# Patient Record
Sex: Male | Born: 1970 | Race: Black or African American | Hispanic: No | Marital: Married | Smoking: Former smoker
Health system: Southern US, Community
[De-identification: ages and names within clinical notes are randomized; demographics above are authoritative.]

## PROBLEM LIST (undated history)

## (undated) DIAGNOSIS — F17200 Nicotine dependence, unspecified, uncomplicated: Secondary | ICD-10-CM

## (undated) DIAGNOSIS — R740 Nonspecific elevation of levels of transaminase and lactic acid dehydrogenase [LDH]: Secondary | ICD-10-CM

## (undated) DIAGNOSIS — I10 Essential (primary) hypertension: Secondary | ICD-10-CM

## (undated) DIAGNOSIS — D693 Immune thrombocytopenic purpura: Secondary | ICD-10-CM

## (undated) HISTORY — DX: Immune thrombocytopenic purpura: D69.3

## (undated) HISTORY — DX: Nicotine dependence, unspecified, uncomplicated: F17.200

## (undated) HISTORY — DX: Nonspecific elevation of levels of transaminase and lactic acid dehydrogenase (ldh): R74.0

## (undated) HISTORY — DX: Essential (primary) hypertension: I10

---

## 2004-10-12 ENCOUNTER — Ambulatory Visit: Payer: Self-pay | Admitting: Family Medicine

## 2006-02-15 DIAGNOSIS — R7401 Elevation of levels of liver transaminase levels: Secondary | ICD-10-CM

## 2006-02-15 HISTORY — DX: Elevation of levels of liver transaminase levels: R74.01

## 2006-12-12 DIAGNOSIS — Z87891 Personal history of nicotine dependence: Secondary | ICD-10-CM | POA: Insufficient documentation

## 2006-12-14 ENCOUNTER — Ambulatory Visit: Payer: Self-pay | Admitting: Family Medicine

## 2006-12-15 LAB — CONVERTED CEMR LAB
ALT: 146 units/L — ABNORMAL HIGH (ref 0–53)
AST: 63 units/L — ABNORMAL HIGH (ref 0–37)
Albumin: 4.2 g/dL (ref 3.5–5.2)
Alkaline Phosphatase: 54 units/L (ref 39–117)
BUN: 12 mg/dL (ref 6–23)
Basophils Absolute: 0 10*3/uL (ref 0.0–0.1)
Basophils Relative: 0.3 % (ref 0.0–1.0)
Bilirubin, Direct: 0.1 mg/dL (ref 0.0–0.3)
CO2: 31 meq/L (ref 19–32)
Calcium: 9.5 mg/dL (ref 8.4–10.5)
Chloride: 100 meq/L (ref 96–112)
Cholesterol: 191 mg/dL (ref 0–200)
Creatinine, Ser: 1 mg/dL (ref 0.4–1.5)
Eosinophils Absolute: 0.2 10*3/uL (ref 0.0–0.6)
Eosinophils Relative: 3.3 % (ref 0.0–5.0)
GFR calc Af Amer: 109 mL/min
GFR calc non Af Amer: 90 mL/min
Glucose, Bld: 90 mg/dL (ref 70–99)
HCT: 46.7 % (ref 39.0–52.0)
HDL: 50.9 mg/dL (ref >39.0)
Hemoglobin: 16 g/dL (ref 13.0–17.0)
LDL Cholesterol: 112 mg/dL — ABNORMAL HIGH (ref 0–99)
Lymphocytes Relative: 29.5 % (ref 12.0–46.0)
MCHC: 34.2 g/dL (ref 30.0–36.0)
MCV: 83.2 fL (ref 78.0–100.0)
Monocytes Absolute: 0.7 10*3/uL (ref 0.2–0.7)
Monocytes Relative: 11.4 % — ABNORMAL HIGH (ref 3.0–11.0)
Neutro Abs: 3.5 10*3/uL (ref 1.4–7.7)
Neutrophils Relative %: 55.5 % (ref 43.0–77.0)
PSA: 0.64 ng/mL (ref 0.10–4.00)
Platelets: 122 10*3/uL — ABNORMAL LOW (ref 150–400)
Potassium: 4.7 meq/L (ref 3.5–5.1)
RBC: 5.61 M/uL (ref 4.22–5.81)
RDW: 13.5 % (ref 11.5–14.6)
Sodium: 138 meq/L (ref 135–145)
TSH: 1.3 microintl units/mL (ref 0.35–5.50)
Total Bilirubin: 0.7 mg/dL (ref 0.3–1.2)
Total CHOL/HDL Ratio: 3.8
Total Protein: 7.4 g/dL (ref 6.0–8.3)
Triglycerides: 140 mg/dL (ref 0–149)
VLDL: 28 mg/dL (ref 0–40)
WBC: 6.3 10*3/uL (ref 4.5–10.5)

## 2006-12-21 ENCOUNTER — Ambulatory Visit: Payer: Self-pay | Admitting: Family Medicine

## 2006-12-21 DIAGNOSIS — R740 Nonspecific elevation of levels of transaminase and lactic acid dehydrogenase [LDH]: Secondary | ICD-10-CM

## 2006-12-21 DIAGNOSIS — D693 Immune thrombocytopenic purpura: Secondary | ICD-10-CM

## 2006-12-21 DIAGNOSIS — R7401 Elevation of levels of liver transaminase levels: Secondary | ICD-10-CM | POA: Insufficient documentation

## 2006-12-21 HISTORY — DX: Immune thrombocytopenic purpura: D69.3

## 2006-12-25 LAB — CONVERTED CEMR LAB
Ceruloplasmin: 29 mg/dL (ref 21–63)
HCV Ab: NEGATIVE
Hep A IgM: NEGATIVE
Hep B C IgM: NEGATIVE
Hepatitis B Surface Ag: NEGATIVE

## 2007-01-13 ENCOUNTER — Ambulatory Visit: Payer: Self-pay | Admitting: Oncology

## 2007-01-13 LAB — CONVERTED CEMR LAB
ALT: 172 units/L — ABNORMAL HIGH (ref 0–53)
AST: 69 units/L — ABNORMAL HIGH (ref 0–37)
Albumin: 4.1 g/dL (ref 3.5–5.2)
Alkaline Phosphatase: 51 units/L (ref 39–117)
Basophils Absolute: 0 10*3/uL (ref 0.0–0.1)
Basophils Relative: 0.5 % (ref 0.0–1.0)
Bilirubin, Direct: 0.1 mg/dL (ref 0.0–0.3)
Eosinophils Absolute: 0.2 10*3/uL (ref 0.0–0.6)
Eosinophils Relative: 3.1 % (ref 0.0–5.0)
Ferritin: 350.5 ng/mL — ABNORMAL HIGH (ref 22.0–322.0)
HCT: 47.3 % (ref 39.0–52.0)
Hemoglobin: 16.1 g/dL (ref 13.0–17.0)
Lymphocytes Relative: 31 % (ref 12.0–46.0)
MCHC: 33.9 g/dL (ref 30.0–36.0)
MCV: 83.5 fL (ref 78.0–100.0)
Monocytes Absolute: 0.5 10*3/uL (ref 0.2–0.7)
Monocytes Relative: 9.8 % (ref 3.0–11.0)
Neutro Abs: 2.9 10*3/uL (ref 1.4–7.7)
Neutrophils Relative %: 55.6 % (ref 43.0–77.0)
Platelets: 118 10*3/uL — ABNORMAL LOW (ref 150–400)
RBC: 5.67 M/uL (ref 4.22–5.81)
RDW: 13.6 % (ref 11.5–14.6)
Sed Rate: 3 mm/hr (ref 0–20)
Total Bilirubin: 0.9 mg/dL (ref 0.3–1.2)
Total Protein: 7.3 g/dL (ref 6.0–8.3)
WBC: 5.2 10*3/uL (ref 4.5–10.5)

## 2007-03-01 ENCOUNTER — Ambulatory Visit: Payer: Self-pay | Admitting: Oncology

## 2007-04-14 LAB — CBC WITH DIFFERENTIAL (CANCER CENTER ONLY)
BASO#: 0.1 10*3/uL (ref 0.0–0.2)
BASO%: 1.2 % (ref 0.0–2.0)
EOS%: 3.5 % (ref 0.0–7.0)
Eosinophils Absolute: 0.2 10*3/uL (ref 0.0–0.5)
HCT: 46.5 % (ref 38.7–49.9)
HGB: 15.9 g/dL (ref 13.0–17.1)
LYMPH#: 2.2 10*3/uL (ref 0.9–3.3)
LYMPH%: 35.3 % (ref 14.0–48.0)
MCH: 27.8 pg — ABNORMAL LOW (ref 28.0–33.4)
MCHC: 34.2 g/dL (ref 32.0–35.9)
MCV: 81 fL — ABNORMAL LOW (ref 82–98)
MONO#: 0.5 10*3/uL (ref 0.1–0.9)
MONO%: 7.5 % (ref 0.0–13.0)
NEUT#: 3.3 10*3/uL (ref 1.5–6.5)
NEUT%: 52.5 % (ref 40.0–80.0)
Platelets: 127 10*3/uL — ABNORMAL LOW (ref 145–400)
RBC: 5.72 10*6/uL — ABNORMAL HIGH (ref 4.20–5.70)
RDW: 13.1 % (ref 10.5–14.6)
WBC: 6.3 10*3/uL (ref 4.0–10.0)

## 2007-10-10 ENCOUNTER — Ambulatory Visit: Payer: Self-pay | Admitting: Oncology

## 2007-10-11 ENCOUNTER — Encounter: Payer: Self-pay | Admitting: Family Medicine

## 2007-10-11 LAB — CBC WITH DIFFERENTIAL (CANCER CENTER ONLY)
BASO#: 0 10*3/uL (ref 0.0–0.2)
BASO%: 0.6 % (ref 0.0–2.0)
EOS%: 2.9 % (ref 0.0–7.0)
Eosinophils Absolute: 0.2 10*3/uL (ref 0.0–0.5)
HCT: 44.9 % (ref 38.7–49.9)
HGB: 15.6 g/dL (ref 13.0–17.1)
LYMPH#: 1.8 10*3/uL (ref 0.9–3.3)
LYMPH%: 29.8 % (ref 14.0–48.0)
MCH: 28.1 pg (ref 28.0–33.4)
MCHC: 34.8 g/dL (ref 32.0–35.9)
MCV: 81 fL — ABNORMAL LOW (ref 82–98)
MONO#: 0.5 10*3/uL (ref 0.1–0.9)
MONO%: 8 % (ref 0.0–13.0)
NEUT#: 3.5 10*3/uL (ref 1.5–6.5)
NEUT%: 58.7 % (ref 40.0–80.0)
Platelets: 188 10*3/uL (ref 145–400)
RBC: 5.56 10*6/uL (ref 4.20–5.70)
RDW: 13 % (ref 10.5–14.6)
WBC: 5.9 10*3/uL (ref 4.0–10.0)

## 2007-10-11 LAB — CHCC SATELLITE - SMEAR

## 2010-02-20 ENCOUNTER — Ambulatory Visit
Admission: RE | Admit: 2010-02-20 | Discharge: 2010-02-20 | Payer: Self-pay | Source: Home / Self Care | Attending: Family Medicine | Admitting: Family Medicine

## 2010-02-20 ENCOUNTER — Other Ambulatory Visit: Payer: Self-pay | Admitting: Family Medicine

## 2010-02-20 DIAGNOSIS — M79609 Pain in unspecified limb: Secondary | ICD-10-CM | POA: Insufficient documentation

## 2010-02-20 LAB — CBC WITH DIFFERENTIAL/PLATELET
Basophils Absolute: 0 10*3/uL (ref 0.0–0.1)
Basophils Relative: 0.4 % (ref 0.0–3.0)
Eosinophils Absolute: 0.1 10*3/uL (ref 0.0–0.7)
Eosinophils Relative: 1.5 % (ref 0.0–5.0)
HCT: 46.6 % (ref 39.0–52.0)
Hemoglobin: 16 g/dL (ref 13.0–17.0)
Lymphocytes Relative: 27.4 % (ref 12.0–46.0)
Lymphs Abs: 1.2 10*3/uL (ref 0.7–4.0)
MCHC: 34.3 g/dL (ref 30.0–36.0)
MCV: 83.9 fl (ref 78.0–100.0)
Monocytes Absolute: 0.5 10*3/uL (ref 0.1–1.0)
Monocytes Relative: 11.8 % (ref 3.0–12.0)
Neutro Abs: 2.6 10*3/uL (ref 1.4–7.7)
Neutrophils Relative %: 58.9 % (ref 43.0–77.0)
Platelets: 131 10*3/uL — ABNORMAL LOW (ref 150.0–400.0)
RBC: 5.56 Mil/uL (ref 4.22–5.81)
RDW: 14.7 % — ABNORMAL HIGH (ref 11.5–14.6)
WBC: 4.4 10*3/uL — ABNORMAL LOW (ref 4.5–10.5)

## 2010-02-20 LAB — URIC ACID: Uric Acid, Serum: 3.8 mg/dL — ABNORMAL LOW (ref 4.0–7.8)

## 2010-03-19 NOTE — Assessment & Plan Note (Signed)
Summary: reestablish/ hand is swollen/alc   Vital Signs:  Patient profile:   40 year old male Height:      73.5 inches Weight:      216.75 pounds BMI:     28.31 Temp:     98.3 degrees F oral Pulse rate:   78 / minute Pulse rhythm:   regular BP sitting:   130 / 84  (left arm) Cuff size:   large  Vitals Entered By: Selena Batten Dance CMA (AAMA) (February 20, 2010 8:42 AM) CC: Re-establish care and left hand is swollen   History of Present Illness: CC: L hand swelling, re establish  4d h/o L hand swelling, painful to grip.  on back of hand.  Has tried tylenol, ibuprofen.  Has tried ice.  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  denies injury or scratches.  denies erythema or streaking or warmth.  + dog at home, no cats.  No h/o gout, no f/c, abd pain, v/n, no pain anywhere else.  Never had anything like this before.  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  + minimal erythema of dorsum of hand.  no significant warmth.  weak grip no scaphoid tenderness.  no wrist tenderness with palpation.  no MCP joint or phalageal joint tenderness. Pulses:  2+ rad pulses Neurologic:  sensation intact. strength intact bilaterally   Impression & Recommendations:  Problem # 1:  HAND PAIN, LEFT (ICD-729.5) denies trauma.  xray bones look intact.  infectious vs  inflammatory condition (gout, synovitis).  treat for both for now with abx and nsaid rest and elevation and ice.  cbc and uric acid today.  Orders: Venipuncture (91478) TLB-CBC Platelet - w/Differential (85025-CBCD) TLB-Uric Acid, Blood (84550-URIC) T-Hand Left 3 Views (73130TC)  Complete Medication List: 1)  Naprosyn 500 Mg Tabs (Naproxen) .... Take one by mouth two times a day x 10 days then as needed with food, don't take with ibuprofen 2)  Bactrim Ds 800-160 Mg Tabs (Sulfamethoxazole-trimethoprim) .... Take two  by mouth two times a day x 10 days  Patient Instructions: 1)  Return next week for follow up. 2)  Xray of hand today. 3)  Treat as inflammation and infection with naprosyn twice daily for 10 days and bactrim 2 pills twice daily for 10 days. 4)  don't take ibuprofen while on naprosyn. 5)  lab work today to check gout, infection. Prescriptions: BACTRIM DS 800-160 MG TABS (SULFAMETHOXAZOLE-TRIMETHOPRIM) take two  by mouth two times a day x 10 days  #40 x 0   Entered and Authorized by:   Eustaquio Boyden  MD   Signed by:   Eustaquio Boyden  MD on 02/20/2010   Method used:   Print then Give to Patient   RxID:   [id number] NAPROSYN 500 MG TABS (NAPROXEN) take one by mouth two times a  day x 10 days then as needed with food, don't take with ibuprofen  #30 x 0   Entered and Authorized by:   Eustaquio Boyden  MD   Signed by:   Eustaquio Boyden  MD on 02/20/2010   Method used:   Print then Give to Patient   RxID:   [id number]    Orders Added: 1)  Venipuncture [56213] 2)  TLB-CBC Platelet - w/Differential [85025-CBCD] 3)  TLB-Uric Acid, Blood [84550-URIC] 4)  T-Hand Left 3 Views [73130TC] 5)  New Patient Level II [99202]    Prior Medications: Current Allergies (reviewed today): No known allergies

## 2012-10-04 ENCOUNTER — Other Ambulatory Visit: Payer: Self-pay | Admitting: Family Medicine

## 2012-10-04 ENCOUNTER — Encounter: Payer: Self-pay | Admitting: Family Medicine

## 2012-10-04 DIAGNOSIS — Z8042 Family history of malignant neoplasm of prostate: Secondary | ICD-10-CM

## 2012-10-04 DIAGNOSIS — Z125 Encounter for screening for malignant neoplasm of prostate: Secondary | ICD-10-CM

## 2012-10-04 DIAGNOSIS — R7401 Elevation of levels of liver transaminase levels: Secondary | ICD-10-CM

## 2012-10-04 DIAGNOSIS — D696 Thrombocytopenia, unspecified: Secondary | ICD-10-CM

## 2012-10-05 ENCOUNTER — Other Ambulatory Visit (INDEPENDENT_AMBULATORY_CARE_PROVIDER_SITE_OTHER): Payer: Federal, State, Local not specified - PPO

## 2012-10-05 DIAGNOSIS — Z125 Encounter for screening for malignant neoplasm of prostate: Secondary | ICD-10-CM

## 2012-10-05 DIAGNOSIS — Z8042 Family history of malignant neoplasm of prostate: Secondary | ICD-10-CM

## 2012-10-05 DIAGNOSIS — R7401 Elevation of levels of liver transaminase levels: Secondary | ICD-10-CM

## 2012-10-05 DIAGNOSIS — D696 Thrombocytopenia, unspecified: Secondary | ICD-10-CM

## 2012-10-05 LAB — CBC WITH DIFFERENTIAL/PLATELET
Basophils Absolute: 0 10*3/uL (ref 0.0–0.1)
Basophils Relative: 0.5 % (ref 0.0–3.0)
Eosinophils Absolute: 0.2 10*3/uL (ref 0.0–0.7)
Eosinophils Relative: 3 % (ref 0.0–5.0)
HCT: 47.2 % (ref 39.0–52.0)
Hemoglobin: 16 g/dL (ref 13.0–17.0)
Lymphocytes Relative: 29.8 % (ref 12.0–46.0)
Lymphs Abs: 1.6 10*3/uL (ref 0.7–4.0)
MCHC: 33.9 g/dL (ref 30.0–36.0)
MCV: 83.2 fl (ref 78.0–100.0)
Monocytes Absolute: 0.6 10*3/uL (ref 0.1–1.0)
Monocytes Relative: 11.3 % (ref 3.0–12.0)
Neutro Abs: 3 10*3/uL (ref 1.4–7.7)
Neutrophils Relative %: 55.4 % (ref 43.0–77.0)
Platelets: 122 10*3/uL — ABNORMAL LOW (ref 150.0–400.0)
RBC: 5.68 Mil/uL (ref 4.22–5.81)
RDW: 14.1 % (ref 11.5–14.6)
WBC: 5.4 10*3/uL (ref 4.5–10.5)

## 2012-10-05 LAB — LIPID PANEL
Cholesterol: 170 mg/dL (ref 0–200)
HDL: 50.6 mg/dL (ref >39.00)
LDL Cholesterol: 108 mg/dL — ABNORMAL HIGH (ref 0–99)
Total CHOL/HDL Ratio: 3
Triglycerides: 56 mg/dL (ref 0.0–149.0)
VLDL: 11.2 mg/dL (ref 0.0–40.0)

## 2012-10-05 LAB — COMPREHENSIVE METABOLIC PANEL
ALT: 28 U/L (ref 0–53)
AST: 29 U/L (ref 0–37)
Albumin: 4.3 g/dL (ref 3.5–5.2)
Alkaline Phosphatase: 47 U/L (ref 39–117)
BUN: 19 mg/dL (ref 6–23)
CO2: 28 mEq/L (ref 19–32)
Calcium: 9.2 mg/dL (ref 8.4–10.5)
Chloride: 104 mEq/L (ref 96–112)
Creatinine, Ser: 1 mg/dL (ref 0.4–1.5)
GFR: 109.13 mL/min (ref >60.00)
Glucose, Bld: 95 mg/dL (ref 70–99)
Potassium: 4.4 mEq/L (ref 3.5–5.1)
Sodium: 137 mEq/L (ref 135–145)
Total Bilirubin: 0.8 mg/dL (ref 0.3–1.2)
Total Protein: 7.3 g/dL (ref 6.0–8.3)

## 2012-10-05 LAB — PSA: PSA: 0.92 ng/mL (ref 0.10–4.00)

## 2012-10-13 ENCOUNTER — Encounter: Payer: Self-pay | Admitting: Family Medicine

## 2012-10-13 ENCOUNTER — Ambulatory Visit (INDEPENDENT_AMBULATORY_CARE_PROVIDER_SITE_OTHER): Payer: Federal, State, Local not specified - PPO | Admitting: Family Medicine

## 2012-10-13 VITALS — BP 120/84 | HR 68 | Temp 98.2°F | Ht 73.5 in | Wt 225.0 lb

## 2012-10-13 DIAGNOSIS — D696 Thrombocytopenia, unspecified: Secondary | ICD-10-CM

## 2012-10-13 DIAGNOSIS — Z0000 Encounter for general adult medical examination without abnormal findings: Secondary | ICD-10-CM | POA: Insufficient documentation

## 2012-10-13 DIAGNOSIS — F17200 Nicotine dependence, unspecified, uncomplicated: Secondary | ICD-10-CM

## 2012-10-13 DIAGNOSIS — Z23 Encounter for immunization: Secondary | ICD-10-CM

## 2012-10-13 NOTE — Addendum Note (Signed)
Addended by: Eliezer Bottom on: 10/13/2012 02:48 PM   Modules accepted: Orders

## 2012-10-13 NOTE — Progress Notes (Signed)
Subjective:    Patient ID: Mike Knight, male    DOB: 1970/05/29, 42 y.o.   MRN: [id number]  HPI CC: CPE  Physical for foster care today.  Wt Readings from Last 3 Encounters:  10/13/12 225 lb (102.059 kg)  02/20/10 216 lb 12 oz (98.317 kg)  12/21/06 223 lb (101.152 kg)   Preventative: Prostate cancer screening - would like to defer for now.  Father dx at age 22 yo of prostate cancer.  No nocturia, strong stream.  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  Past histories reviewed and updated if relevant as below. Patient Active Problem List   Diagnosis Date Noted  . HAND PAIN, LEFT 02/20/2010  . THROMBOCYTOPENIA 12/21/2006  . TRANSAMINASES, SERUM, ELEVATED 12/21/2006  . TOBACCO ABUSE 12/12/2006   Past Medical History  Diagnosis Date  . Transaminitis 2008    normal hep panel, ceruloplasmin, elevated ferritin  . Smoker    History reviewed. No pertinent past surgical history. History  Substance Use Topics  . Smoking status: Former Smoker    Quit date: 09/15/2012  . Smokeless tobacco: Never Used  . Alcohol Use: Yes     Comment: occasional   Family History  Problem Relation Age of Onset  . Cancer Father 65    prostate s/p removal  . Diabetes Paternal Grandfather   . Hypertension Paternal Grandfather   . Cancer Maternal Grandfather     lung, smoker   No Known Allergies No current outpatient prescriptions on file prior to visit.   No current facility-administered medications on file prior to visit.     Review of Systems  Constitutional: Negative for fever, chills, activity change, appetite change, fatigue and unexpected weight change.  HENT: Negative for hearing loss and neck pain.   Eyes: Negative for visual  disturbance.  Respiratory: Negative for cough, chest tightness, shortness of breath and wheezing.   Cardiovascular: Negative for chest pain, palpitations and leg swelling.  Gastrointestinal: Negative for nausea, vomiting, abdominal pain, diarrhea, constipation, blood in stool and abdominal distention.  Genitourinary: Negative for hematuria and difficulty urinating.  Musculoskeletal: Negative for myalgias and arthralgias.  Skin: Negative for rash.  Neurological: Negative for dizziness, seizures, syncope and headaches.  Hematological: Negative for adenopathy. Does not bruise/bleed easily.  Psychiatric/Behavioral: Negative for dysphoric mood. The patient is not nervous/anxious.        Objective:   Physical Exam  Nursing note and vitals reviewed. Constitutional: He is oriented to person, place, and time. He appears well-developed and well-nourished. No distress.  HENT:  Head: Normocephalic and atraumatic.  Right Ear: Hearing, tympanic membrane, external ear and ear canal normal.  Left Ear: Hearing, tympanic membrane, external ear and ear canal normal.  Nose: Nose normal.  Mouth/Throat: Uvula is midline and oropharynx is clear and moist. No oropharyngeal exudate, posterior oropharyngeal edema, posterior oropharyngeal erythema or tonsillar abscesses.  Eyes: Conjunctivae and EOM are normal. Pupils are equal, round, and reactive to light. No scleral icterus.  Neck: Normal range of motion. Neck supple. No thyromegaly present.  Cardiovascular: Normal rate, regular rhythm, normal heart sounds and intact distal pulses.   No murmur heard. Pulses:      Radial pulses are 2+ on the right side, and 2+ on the left side.  Pulmonary/Chest: Effort normal and breath  sounds normal. No respiratory distress. He has no wheezes. He has no rales.  Abdominal: Soft. Bowel sounds are normal. He exhibits no distension and no mass. There is no tenderness. There is no rebound and no guarding.  Musculoskeletal: Normal  range of motion. He exhibits no edema.  Lymphadenopathy:    He has no cervical adenopathy.  Neurological: He is alert and oriented to person, place, and time.  CN grossly intact, station and gait intact  Skin: Skin is warm and dry. No rash noted.  Psychiatric: He has a normal mood and affect. His behavior is normal. Judgment and thought content normal.      Assessment & Plan:

## 2012-10-13 NOTE — Assessment & Plan Note (Signed)
Chronic. Per patient, has been told his platelets were larger than normal but functioning properly.

## 2012-10-13 NOTE — Patient Instructions (Signed)
Good to see you today Tdap today. Form filled out Return in 1 year for next physical.

## 2012-10-13 NOTE — Assessment & Plan Note (Signed)
Quit 09/2012 - encouraged continued abstinence.

## 2012-10-13 NOTE — Assessment & Plan Note (Signed)
Preventative protocols reviewed and updated unless pt declined. Discussed healthy diet and lifestyle.  Congratulated on recent weight loss - 8 lbs in last 2-3 weeks, healthier diet and activity.

## 2013-01-17 ENCOUNTER — Emergency Department: Payer: Self-pay | Admitting: Emergency Medicine

## 2013-01-19 ENCOUNTER — Encounter: Payer: Self-pay | Admitting: Family Medicine

## 2013-01-19 ENCOUNTER — Ambulatory Visit (INDEPENDENT_AMBULATORY_CARE_PROVIDER_SITE_OTHER): Payer: Federal, State, Local not specified - PPO | Admitting: Family Medicine

## 2013-01-19 VITALS — BP 138/84 | HR 69 | Temp 97.4°F | Wt 238.8 lb

## 2013-01-19 DIAGNOSIS — S39012A Strain of muscle, fascia and tendon of lower back, initial encounter: Secondary | ICD-10-CM | POA: Insufficient documentation

## 2013-01-19 DIAGNOSIS — M5432 Sciatica, left side: Secondary | ICD-10-CM

## 2013-01-19 DIAGNOSIS — M5430 Sciatica, unspecified side: Secondary | ICD-10-CM
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
## 2013-01-19 NOTE — Progress Notes (Signed)
Pre-visit discussion using our clinic review tool. No additional management support is needed unless otherwise documented below in the visit note.  
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
## 2013-01-25 ENCOUNTER — Ambulatory Visit (INDEPENDENT_AMBULATORY_CARE_PROVIDER_SITE_OTHER): Payer: Federal, State, Local not specified - PPO | Admitting: Family Medicine

## 2013-01-25 ENCOUNTER — Encounter: Payer: Self-pay | Admitting: Family Medicine

## 2013-01-25 VITALS — BP 118/82 | HR 72 | Temp 97.7°F | Wt 234.5 lb

## 2013-01-25 DIAGNOSIS — Z5189 Encounter for other specified aftercare: Secondary | ICD-10-CM

## 2013-01-25 DIAGNOSIS — S39012D Strain of muscle, fascia and tendon of lower back, subsequent encounter: Secondary | ICD-10-CM

## 2013-01-25 MED ORDER — TRAMADOL HCL 50 MG PO TABS: 50.0000 mg | ORAL_TABLET | Freq: Four times a day (QID) | ORAL | Status: DC | PRN
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
## 2013-01-25 NOTE — Assessment & Plan Note (Signed)
Exam today no longer consistent with left sided sciatica but rather persistent upper lumbar paraspinous muscle strain. Markedly improved exam today, however persistent stiffness and pain with certain movements ie bending forward.   Lumbar muscles remain tight and in spasm. Encouraged continued medication use as well as start ice / heating pad to left lower back I recommend he return to work on Monday 01/29/2013 for light duty for 1 week and regular duty Monday 02/05/2013. Letter provided today. Encouraged continued home stretches provided last visit for sciatica.

## 2013-01-25 NOTE — Progress Notes (Signed)
Pre-visit discussion using our clinic review tool. No additional management support is needed unless otherwise documented below in the visit note.  

## 2013-01-25 NOTE — Patient Instructions (Signed)
There is improvement-  I recommend light duty starting 01/29/2013 for 1 week then return to normal activity level.  Continue staying active - this will speed recovery. Ice/heat to back  Whichever soothes better. May continue medicines as up to now. I've refilled tramadol.

## 2013-03-14 ENCOUNTER — Ambulatory Visit (INDEPENDENT_AMBULATORY_CARE_PROVIDER_SITE_OTHER): Payer: Federal, State, Local not specified - PPO | Admitting: Family Medicine

## 2013-03-14 ENCOUNTER — Encounter: Payer: Self-pay | Admitting: Family Medicine

## 2013-03-14 VITALS — BP 110/78 | HR 88 | Temp 97.4°F | Wt 235.5 lb

## 2013-03-14 DIAGNOSIS — S335XXA Sprain of ligaments of lumbar spine, initial encounter: Secondary | ICD-10-CM

## 2013-03-14 DIAGNOSIS — S39012A Strain of muscle, fascia and tendon of lower back, initial encounter: Secondary | ICD-10-CM

## 2013-03-14 NOTE — Patient Instructions (Signed)
I think you are doing well today. Letter provided. Call us with questions.

## 2013-03-14 NOTE — Assessment & Plan Note (Signed)
Seems to have fully healed from lumbar strain. Back to regular duty at work for the last month. Not needing anti inflammatory or muscle relaxant Back to gym - discussed slow increase in activity especially weights. Letter provided today.
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
## 2013-03-14 NOTE — Progress Notes (Signed)
Pre-visit discussion using our clinic review tool. No additional management support is needed unless otherwise documented below in the visit note.  

## 2013-05-08 ENCOUNTER — Telehealth: Payer: Self-pay
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
## 2014-05-01 ENCOUNTER — Encounter: Payer: Self-pay | Admitting: Family Medicine

## 2014-05-01 ENCOUNTER — Ambulatory Visit (INDEPENDENT_AMBULATORY_CARE_PROVIDER_SITE_OTHER): Payer: Federal, State, Local not specified - PPO | Admitting: Family Medicine

## 2014-05-01 VITALS — BP 150/100 | HR 76 | Temp 98.1°F | Ht 73.5 in | Wt 234.8 lb

## 2014-05-01 DIAGNOSIS — E669 Obesity, unspecified: Secondary | ICD-10-CM | POA: Insufficient documentation

## 2014-05-01 DIAGNOSIS — I10 Essential (primary) hypertension: Secondary | ICD-10-CM | POA: Insufficient documentation

## 2014-05-01 DIAGNOSIS — D693 Immune thrombocytopenic purpura: Secondary | ICD-10-CM

## 2014-05-01 DIAGNOSIS — R030 Elevated blood-pressure reading, without diagnosis of hypertension: Secondary | ICD-10-CM

## 2014-05-01 DIAGNOSIS — Z0000 Encounter for general adult medical examination without abnormal findings: Secondary | ICD-10-CM

## 2014-05-01 HISTORY — DX: Essential (primary) hypertension: I10

## 2014-05-01 LAB — CBC WITH DIFFERENTIAL/PLATELET
Basophils Absolute: 0 10*3/uL (ref 0.0–0.1)
Basophils Relative: 0.5 % (ref 0.0–3.0)
Eosinophils Absolute: 0 10*3/uL (ref 0.0–0.7)
Eosinophils Relative: 0.7 % (ref 0.0–5.0)
HCT: 48.9 % (ref 39.0–52.0)
Hemoglobin: 17 g/dL (ref 13.0–17.0)
Lymphocytes Relative: 21.5 % (ref 12.0–46.0)
Lymphs Abs: 1.2 10*3/uL (ref 0.7–4.0)
MCHC: 34.8 g/dL (ref 30.0–36.0)
MCV: 81.6 fl (ref 78.0–100.0)
Monocytes Absolute: 0.4 10*3/uL (ref 0.1–1.0)
Monocytes Relative: 7.7 % (ref 3.0–12.0)
Neutro Abs: 3.9 10*3/uL (ref 1.4–7.7)
Neutrophils Relative %: 69.6 % (ref 43.0–77.0)
Platelets: 127 10*3/uL — ABNORMAL LOW (ref 150.0–400.0)
RBC: 6 Mil/uL — ABNORMAL HIGH (ref 4.22–5.81)
RDW: 14 % (ref 11.5–15.5)
WBC: 5.5 10*3/uL (ref 4.0–10.5)

## 2014-05-01 LAB — LIPID PANEL
Cholesterol: 220 mg/dL — ABNORMAL HIGH (ref 0–200)
HDL: 61.7 mg/dL (ref >39.00)
LDL Cholesterol: 137 mg/dL — ABNORMAL HIGH (ref 0–99)
NonHDL: 158.3
Total CHOL/HDL Ratio: 4
Triglycerides: 106 mg/dL (ref 0.0–149.0)
VLDL: 21.2 mg/dL (ref 0.0–40.0)

## 2014-05-01 LAB — COMPREHENSIVE METABOLIC PANEL
ALT: 33 U/L (ref 0–53)
AST: 21 U/L (ref 0–37)
Albumin: 4.7 g/dL (ref 3.5–5.2)
Alkaline Phosphatase: 50 U/L (ref 39–117)
BUN: 13 mg/dL (ref 6–23)
CO2: 31 mEq/L (ref 19–32)
Calcium: 9.7 mg/dL (ref 8.4–10.5)
Chloride: 100 mEq/L (ref 96–112)
Creatinine, Ser: 0.99 mg/dL (ref 0.40–1.50)
GFR: 105.8 mL/min (ref >60.00)
Glucose, Bld: 98 mg/dL (ref 70–99)
Potassium: 4.7 mEq/L (ref 3.5–5.1)
Sodium: 136 mEq/L (ref 135–145)
Total Bilirubin: 0.7 mg/dL (ref 0.2–1.2)
Total Protein: 7.7 g/dL (ref 6.0–8.3)

## 2014-05-01 LAB — TSH: TSH: 0.85 u[IU]/mL (ref 0.35–4.50)

## 2014-05-01 NOTE — Patient Instructions (Addendum)
Blood work today.  Your goal blood pressure is <140/90. You were high today. Work on low salt/sodium diet - goal <1.5gm (1,500mg ) per day. Eat a diet high in fruits/vegetables and whole grains. Look into mediterranean and DASH diet.  Goal activity is 16650min/wk of moderate intensity exercise.  This can be split into 30 minute chunks.  If you are not at this level, you can start with smaller 10-15 min increments and slowly build up activity.  Look at www.heart.org for more resources.  DASH Eating Plan DASH stands for "Dietary Approaches to Stop Hypertension." The DASH eating plan is a healthy eating plan that has been shown to reduce high blood pressure (hypertension). Additional health benefits may include reducing the risk of type 2 diabetes mellitus, heart disease, and stroke. The DASH eating plan may also help with weight loss. WHAT DO I NEED TO KNOW ABOUT THE DASH EATING PLAN? For the DASH eating plan, you will follow these general guidelines:  Choose foods with a percent daily value for sodium of less than 5% (as listed on the food label).  Use salt-free seasonings or herbs instead of table salt or sea salt.  Check with your health care provider or pharmacist before using salt substitutes.  Eat lower-sodium products, often labeled as "lower sodium" or "no salt added."  Eat fresh foods.  Eat more vegetables, fruits, and low-fat dairy products.  Choose whole grains. Look for the word "whole" as the first word in the ingredient list.  Choose fish and skinless chicken or [place] more often than red meat. Limit fish, poultry, and meat to 6 oz (170 g) each day.  Limit sweets, desserts, sugars, and sugary drinks.  Choose heart-healthy fats.  Limit cheese to 1 oz (28 g) per day.  Eat more home-cooked food and less restaurant, buffet, and fast food.  Limit fried foods.  Cook foods using methods other than frying.  Limit canned vegetables. If you do use them, rinse them well to  decrease the sodium.  When eating at a restaurant, ask that your food be prepared with less salt, or no salt if possible. WHAT FOODS CAN I EAT? Seek help from a dietitian for individual calorie needs. Grains Whole grain or whole wheat bread. Brown rice. Whole grain or whole wheat pasta. Quinoa, bulgur, and whole grain cereals. Low-sodium cereals. Corn or whole wheat flour tortillas. Whole grain cornbread. Whole grain crackers. Low-sodium crackers. Vegetables Fresh or frozen vegetables (raw, steamed, roasted, or grilled). Low-sodium or reduced-sodium tomato and vegetable juices. Low-sodium or reduced-sodium tomato sauce and paste. Low-sodium or reduced-sodium canned vegetables.  Fruits All fresh, canned (in natural juice), or frozen fruits. Meat and Other Protein Products Ground beef (85% or leaner), grass-fed beef, or beef trimmed of fat. Skinless chicken or [place]. Ground chicken or [place]key. Pork trimmed of fat. All fish and seafood. Eggs. Dried beans, peas, or lentils. Unsalted nuts and seeds. Unsalted canned beans. Dairy Low-fat dairy products, such as skim or 1% milk, 2% or reduced-fat cheeses, low-fat ricotta or cottage cheese, or plain low-fat yogurt. Low-sodium or reduced-sodium cheeses. Fats and Oils Tub margarines without trans fats. Light or reduced-fat mayonnaise and salad dressings (reduced sodium). Avocado. Safflower, olive, or canola oils. Natural peanut or almond butter. Other Unsalted popcorn and pretzels. The items listed above may not be a complete list of recommended foods or beverages. Contact your dietitian for more options. WHAT FOODS ARE NOT RECOMMENDED? Grains White bread. White pasta. White rice. Refined cornbread. Bagels and croissants. Crackers that  contain trans fat. Vegetables Creamed or fried vegetables. Vegetables in a cheese sauce. Regular canned vegetables. Regular canned tomato sauce and paste. Regular tomato and vegetable juices. Fruits Dried fruits. Canned  fruit in light or heavy syrup. Fruit juice. Meat and Other Protein Products Fatty cuts of meat. Ribs, chicken wings, bacon, sausage, bologna, salami, chitterlings, fatback, hot dogs, bratwurst, and packaged luncheon meats. Salted nuts and seeds. Canned beans with salt. Dairy Whole or 2% milk, cream, half-and-half, and cream cheese. Whole-fat or sweetened yogurt. Full-fat cheeses or blue cheese. Nondairy creamers and whipped toppings. Processed cheese, cheese spreads, or cheese curds. Condiments Onion and garlic salt, seasoned salt, table salt, and sea salt. Canned and packaged gravies. Worcestershire sauce. Tartar sauce. Barbecue sauce. Teriyaki sauce. Soy sauce, including reduced sodium. Steak sauce. Fish sauce. Oyster sauce. Cocktail sauce. Horseradish. Ketchup and mustard. Meat flavorings and tenderizers. Bouillon cubes. Hot sauce. Tabasco sauce. Marinades. Taco seasonings. Relishes. Fats and Oils Butter, stick margarine, lard, shortening, ghee, and bacon fat. Coconut, palm kernel, or palm oils. Regular salad dressings. Other Pickles and olives. Salted popcorn and pretzels. The items listed above may not be a complete list of foods and beverages to avoid. Contact your dietitian for more information. WHERE CAN I FIND MORE INFORMATION? National Heart, Lung, and Blood Institute: [employer] Document Released: 01/21/2011 Document Revised: 06/18/2013 Document Reviewed: 12/06/2012 [facility] Patient Information 2015 [place], [place]. This information is not intended to replace advice given to you by your health care provider. Make sure you discuss any questions you have with your health care provider.

## 2014-05-01 NOTE — Assessment & Plan Note (Signed)
Reviewed healthy diet and lifestyle changes to affect sustainable weight loss.  

## 2014-05-01 NOTE — Progress Notes (Signed)
Pre visit review using our clinic review tool, if applicable. No additional management support is needed unless otherwise documented below in the visit note. 

## 2014-05-01 NOTE — Progress Notes (Signed)
BP 150/100 mmHg  Pulse 76  Temp(Src) 98.1 F (36.7 C) (Oral)  Ht 6' 1.5" (1.867 m)  Wt 234 lb 12 oz (106.482 kg)  BMI 30.55 kg/m2   CC: CPE  Subjective:    Patient ID: Mike Knight, male    DOB: 01/14/1971, 44 y.o.   MRN: [id number]  HPI: Mike Knight is a 44 y.o. male presenting on 05/01/2014 for Annual Exam   BP elevated when seen by dentist 01/2014 as well. Unsure how high. No HA, vision changes, CP/tightness, SOB, leg swelling.  Preventative: Prostate cancer screening - would like to defer for now. Father dx at age 44 yo of prostate cancer. occasional nocturia, strong stream. Desires to start at 45yo. 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 Interim medical history since our last visit reviewed. Allergies and medications reviewed and updated. Current Outpatient Prescriptions on File Prior to Visit  Medication Sig  . ibuprofen (ADVIL,MOTRIN) 800 MG tablet Take 800 mg by mouth 3 (three) times daily.  . Multiple Vitamins-Minerals (MULTIVITAMIN PO) Take 1 tablet by mouth daily.   No current facility-administered medications on file prior to visit.    Review of Systems  Constitutional: Negative for fever, chills, activity change, appetite change, fatigue and unexpected weight change.  HENT: Negative for hearing loss.   Eyes: Negative for visual disturbance.  Respiratory: Negative for cough, chest tightness, shortness of breath and wheezing.   Cardiovascular: Negative for chest pain, palpitations and leg swelling.  Gastrointestinal: Negative for nausea, vomiting, abdominal pain, diarrhea, constipation, blood in stool and abdominal distention.  Genitourinary: Negative for hematuria and  difficulty urinating.  Musculoskeletal: Negative for myalgias, arthralgias and neck pain.  Skin: Negative for rash.  Neurological: Negative for dizziness, seizures, syncope and headaches.  Hematological: Negative for adenopathy. Does not bruise/bleed easily.  Psychiatric/Behavioral: Negative for dysphoric mood. The patient is not nervous/anxious.    Per HPI unless specifically indicated above     Objective:    BP 150/100 mmHg  Pulse 76  Temp(Src) 98.1 F (36.7 C) (Oral)  Ht 6' 1.5" (1.867 m)  Wt 234 lb 12 oz (106.482 kg)  BMI 30.55 kg/m2  Wt Readings from Last 3 Encounters:  05/01/14 234 lb 12 oz (106.482 kg)  03/14/13 235 lb 8 oz (106.822 kg)  01/25/13 234 lb 8 oz (106.369 kg)    Physical Exam  Constitutional: He is oriented to person, place, and time. He appears well-developed and well-nourished. No distress.  HENT:  Head: Normocephalic and atraumatic.  Right Ear: Hearing, tympanic membrane, external ear and ear canal normal.  Left Ear: Hearing, tympanic membrane, external ear and ear canal normal.  Nose: Nose normal.  Mouth/Throat: Uvula is midline, oropharynx is clear and moist and mucous membranes are normal. No oropharyngeal exudate, posterior oropharyngeal edema or posterior oropharyngeal erythema.  Eyes: Conjunctivae and EOM are normal. Pupils are equal, round, and reactive to light. No scleral icterus.  Neck: Normal range of motion. Neck supple. No thyromegaly present.  Cardiovascular: Normal rate, regular rhythm, normal heart sounds and intact distal pulses.   No murmur heard. Pulses:      Radial pulses are 2+ on the right side, and 2+ on the left side.  Pulmonary/Chest: Effort normal and breath sounds normal. No respiratory distress.  He has no wheezes. He has no rales.  Abdominal: Soft. Bowel sounds are normal. He exhibits no distension and no mass. There is no tenderness. There is no rebound and no guarding.  Musculoskeletal: Normal range of motion. He exhibits no  edema.  Lymphadenopathy:    He has no cervical adenopathy.  Neurological: He is alert and oriented to person, place, and time.  CN grossly intact, station and gait intact  Skin: Skin is warm and dry. No rash noted.  Psychiatric: He has a normal mood and affect. His behavior is normal. Judgment and thought content normal.  Nursing note and vitals reviewed.     Assessment & Plan:   Problem List Items Addressed This Visit    Routine health maintenance - Primary    Preventative protocols reviewed and updated unless pt declined. Discussed healthy diet and lifestyle.       Obesity    Reviewed healthy diet and lifestyle changes to affect sustainable weight loss.      Elevated blood pressure reading without diagnosis of hypertension    Elevated blood pressure noted today, again on repeat. Discussed lifestyle changes (diet, exercise) to lower blood pressure. Return in 58mo for f/u visit and if persistently elevated pt aware we will discuss starting antihypertensive.      Relevant Orders   Lipid panel   Comprehensive metabolic panel   TSH   Chronic idiopathic thrombocytopenia    Check CBC today. Chronic.      Relevant Orders   CBC with Differential/Platelet       Follow up plan: Return in about 3 months (around 08/01/2014), or as needed, for follow up visit.

## 2014-05-01 NOTE — Assessment & Plan Note (Signed)
Check CBC today. Chronic.

## 2014-05-01 NOTE — Assessment & Plan Note (Signed)
Elevated blood pressure noted today, again on repeat. Discussed lifestyle changes (diet, exercise) to lower blood pressure. Return in 36mo for f/u visit and if persistently elevated pt aware we will discuss starting antihypertensive.

## 2014-05-01 NOTE — Assessment & Plan Note (Signed)
Preventative protocols reviewed and updated unless pt declined. Discussed healthy diet and lifestyle.  

## 2014-05-02 ENCOUNTER — Encounter: Payer: Self-pay | Admitting: *Deleted

## 2014-05-02 ENCOUNTER — Telehealth: Payer: Self-pay

## 2014-05-02 ENCOUNTER — Ambulatory Visit: Payer: Federal, State, Local not specified - PPO | Admitting: Family Medicine

## 2014-05-02 MED ORDER — AMLODIPINE BESYLATE 5 MG PO TABS: 5.0000 mg | ORAL_TABLET | Freq: Every day | ORAL | Status: DC

## 2014-05-02 NOTE — Telephone Encounter (Signed)
Pt seen yesterday. bp remaining elevated. Would have him start amlodipine 5mg  daily and return in 1 mo for f/u. Call us in 2 weeks with bp update As long as he's not having any sxs, doesn't really need appt today unless pt desires to keep it.

## 2014-05-02 NOTE — Telephone Encounter (Signed)
PLEASE NOTE: All timestamps contained within this report are represented as [place] Standard Time. CONFIDENTIALTY NOTICE: This fax transmission is intended only for the addressee. It contains information that is legally privileged, confidential or otherwise protected from use or disclosure. If you are not the intended recipient, you are strictly prohibited from reviewing, disclosing, copying using or disseminating any of this information or taking any action in reliance on or regarding this information. If you have received this fax in error, please notify us immediately by telephone so that we can arrange for its return to [place]. Phone: [phone], Toll-Free: [phone], Fax: [phone] Page: 1 of 2 Call Id: [id number] [facility] - Client TELEPHONE ADVICE RECORD [facility] Patient Name: Mike Knight Gender: Male DOB: October 26, 1970 Age: 44 Y 7 M 13 D Return Phone Number: [phone] (Primary), [phone] (Secondary) Address: [address] City/State/Zip: [place] [address] Client [facility] - Client Client Site  [facility] - Night Physician Eustaquio Boyden Contact Type Call Call Type Triage / Clinical Caller Name Olaf Mesa Relationship To Patient Self Return Phone Number [phone] (Primary) Chief Complaint Blood Pressure High Initial Comment Caller states her husband's blood pressure is 154/109. PreDisposition Did not know what to do Nurse Assessment Nurse: Orvis Brill, RN, Olegario Messier Date/Time Lamount Cohen Time): 05/01/2014 8:01:28 PM Confirm and document reason for call. If symptomatic, describe symptoms. ---Caller states that his BP was 154/109 approx 30" ago, is not being treated for HTN. Has the patient traveled out of the country within the last 30 days? ---No Does the patient require triage? ---Yes Related visit to physician within the last 2 weeks? ---No Does  the PT have any chronic conditions? (i.e. diabetes, asthma, etc.) ---No Guidelines Guideline Title Affirmed Question Affirmed Notes Nurse Date/Time (Eastern Time) High Blood Pressure BP # 160/100 Pearline Cables 05/01/2014 8:02:31 PM Disp. Time Lamount Cohen Time) Disposition Final User 05/01/2014 8:08:49 PM See PCP When Office is Open (within 3 days) Yes Orvis Brill, RN, Sammuel Bailiff Understands: Yes Disagree/Comply: Comply Care Advice Given Per Guideline SEE PCP WITHIN 3 DAYS: You need to be examined within 2 or 3 days. Call your doctor during regular office hours and make an appointment. (Note: if office will be open tomorrow, tell caller to call then, not in 3 days). HIGH BLOOD PRESSURE: * Untreated PLEASE NOTE: All timestamps contained within this report are represented as [place] Standard Time. CONFIDENTIALTY NOTICE: This fax transmission is intended only for the addressee. It contains information that is legally privileged, confidential or otherwise protected from use or disclosure. If you are not the intended recipient, you are strictly prohibited from reviewing, disclosing, copying using or disseminating any of this information or taking any action in reliance on or regarding this information. If you have received this fax in error, please notify us immediately by telephone so that we can arrange for its return to [place]. Phone: [phone], Toll-Free: [phone], Fax: [phone] Page: 2 of 2 Call Id: [id number] Care Advice Given Per Guideline high blood pressure may cause damage to your heart, brain, kidneys, and eyes. * Treatment of high blood pressure can reduce the risk of stroke, heart attack, and heart failure. * The goal of blood pressure treatment for most patients with hypertension is to keep the blood pressure under 140/90. LIFESTYLE MODIFICATIONS - The following things can help you reduce your blood pressure: * EAT HEALTHY: Eat a diet rich in fresh fruits and vegetables, dietary  fiber, non-animal  protein (e.g., soy), and low-fat dairy products. Avoid foods with a high content of saturated fat or cholesterol. * DECREASE SODIUM INTAKE: Aim to eat less than 1,500 mg of sodium each day. Unfortunately 75% of the salt in the average person's diet is in pre-processed foods. * LIMIT ALCOHOL: Limit alcohol to 0-2 standard drinks each day. Men should have less than 14 dinks per week. Women should have less than 9 drinks per week. A drink is 1.5 oz hard liquor (one shot or jigger; 45 ml), 5 oz wine (small glass; 150 ml), 12 oz beer (one can; 360 ml). * EXERCISE, BE MORE PHYSICALLY ACTIVE: Do 30-60 minutes of moderate intensity exercise four to seven times a week. Examples include aerobic activities like brisk walking, cycling, and swimming. * REDUCE WEIGHT AND WAIST LINE: It is important to maintain a normal body weight. The goal should be a BMI (body mass index) under 25 for men and women, a waist circumference under 40 inches (102 cm) in men, and a waist circumference under 35 inches (88 cm) in women. * REDUCE STRESS: Find activities that help reduce your stress. Examples might include meditation, yoga, or even a restful walk in a park. CALL BACK IF: * Weakness or numbness of the face, arm or leg on one side of the body occurs * Difficulty walking, difficulty talking, or severe headache occurs * Chest pain or difficulty breathing occurs * Your blood pressure is over 180/110 * You become worse. CARE ADVICE given per High Blood Pressure (Adult) guideline. After Care Instructions Given Call Event Type User Date / Time Description Comments User: Almira BarKathy, Rowland, RN Date/Time (Eastern Time): 05/01/2014 8:10:16 PM Caller stated initially that he had not been seen within past 2 wks for his BP being high but then stated that he was seen in office earlier today for HA & his BP was increased then. Caller's sp got back on the line & stated that her sp's BP was actually 154/103 instead  of 154/109 as previously noted.

## 2014-05-02 NOTE — Telephone Encounter (Signed)
Patient notified and verbalized understanding. Currently no symptoms. Follow up scheduled and will call with update in 2 weeks.

## 2014-05-03 NOTE — Telephone Encounter (Signed)
Spoke with patient. He said he no longer had a headache. He had taken an allegra earlier and it went away, so he believes it was allergy related. He said his wife is just concerned about his BP. I advised him that it is already doing better on the medication and to keep taking it and monitoring BP. Reminded to avoid any decongestants in the allergy meds to keep from raising BP. He verbalized understanding. He will keep regularly scheduled appt unless something changes.

## 2014-05-03 NOTE — Telephone Encounter (Signed)
PLEASE NOTE: All timestamps contained within this report are represented as [place] Standard Time. CONFIDENTIALTY NOTICE: This fax transmission is intended only for the addressee. It contains information that is legally privileged, confidential or otherwise protected from use or disclosure. If you are not the intended recipient, you are strictly prohibited from reviewing, disclosing, copying using or disseminating any of this information or taking any action in reliance on or regarding this information. If you have received this fax in error, please notify us immediately by telephone so that we can arrange for its return to us. Phone: [phone], Toll-Free: [phone], Fax: [phone] Page: 1 of 1 Call Id: [id number] [facility] - Client TELEPHONE ADVICE RECORD [facility] Patient Name: Mike BibleDENNIS Prows Gender: Male DOB: 06/11/1970 Age: [age] Y 7 M 14 D Return Phone Number: [phone] (Primary) Address: [address] City[place]State[place]Zip: [place] [id number] Client [facility] - Client [facility] Day Physician Eustaquio BoydenGutierrez, Javier Contact Type Call Call Type Triage / Clinical Caller Name Rogelio Seenasho Douthat Relationship To Patient Spouse Appointment Disposition EMR Caller Not Reached Info pasted into Epic No Return Phone Number [phone] (Primary) Chief Complaint Headache Initial Comment Caller says that her husband is still having headaches. He was recently seen for this and was told his BP is high. BP is 129/82 now Nurse Assessment Guidelines Guideline Title Affirmed Question Affirmed Notes Nurse Date/Time (Eastern Time) Disp. Time Lamount Cohen(Eastern Time) Disposition Final User 05/03/2014 8:49:08 AM Attempt made - message left Elijah BirkCaldwell, RN, Stark BrayLynda 05/03/2014 9:03:48 AM Attempt made - no message left Allyson SabalCaldwell, RN, Lynda 05/03/2014 9:18:26 AM FINAL ATTEMPT MADE - no message left Yes  Elijah Birkaldwell, RN, Stark BrayLynda After Care Instructions Given Call Event Type User Date / Time Description

## 2014-05-03 NOTE — Telephone Encounter (Signed)
He did not mention headaches to me If he's having headaches could come in for evaluation today ok to work in.  Alternatively bp med will take some time to take full effect and bring bp down and improve headache if HA is BP related - already bp is doing better. Could try tylenol 500-1000mg  for headache and come in next week sooner than prior scheduled appt if persistent headache

## 2014-05-30 ENCOUNTER — Encounter: Payer: Self-pay | Admitting: Family Medicine

## 2014-05-30 ENCOUNTER — Ambulatory Visit (INDEPENDENT_AMBULATORY_CARE_PROVIDER_SITE_OTHER): Payer: Federal, State, Local not specified - PPO | Admitting: Family Medicine

## 2014-05-30 VITALS — BP 142/98 | HR 86 | Temp 98.3°F | Wt 244.8 lb

## 2014-05-30 DIAGNOSIS — I10 Essential (primary) hypertension: Secondary | ICD-10-CM | POA: Diagnosis not present

## 2014-05-30 MED ORDER — AMLODIPINE BESYLATE 10 MG PO TABS: 10.0000 mg | ORAL_TABLET | Freq: Every day | ORAL | Status: DC

## 2014-05-30 NOTE — Progress Notes (Signed)
Pre visit review using our clinic review tool, if applicable. No additional management support is needed unless otherwise documented below in the visit note. 

## 2014-05-30 NOTE — Progress Notes (Addendum)
BP 142/98 mmHg  Pulse 86  Temp(Src) 98.3 F (36.8 C) (Oral)  Wt 244 lb 12.8 oz (111.041 kg)   CC: f/u HTN  Subjective:    Patient ID: Mike Knight, male    DOB: Dec 20, 1970, 44 y.o.   MRN: [id number]  HPI: Mike Knight is a 44 y.o. male presenting on 05/30/2014 for Follow-up   See prior note for details. Recently with elevated bp readings. Last month started on amlodipine  daily. Tolerating ok. No ankle swelling. No HA, vision changes, CP/tightness, SOB, leg swelling.  Home readings - 132/88, but also high readings.  BP Readings from Last 3 Encounters:  05/30/14 142/98  05/01/14 150/100  03/14/13 110/78   Has changed diet - avoiding salty foods.  Relevant past medical, surgical, family and social history reviewed and updated as indicated. Interim medical history since our last visit reviewed. Allergies and medications reviewed and updated. Current Outpatient Prescriptions on File Prior to Visit  Medication Sig  . ibuprofen (ADVIL,MOTRIN) 800 MG tablet Take 800 mg by mouth 3 (three) times daily.  . Multiple Vitamins-Minerals (MULTIVITAMIN PO) Take 1 tablet by mouth daily.   No current facility-administered medications on file prior to visit.    Review of Systems Per HPI unless specifically indicated above     Objective:    BP 142/98 mmHg  Pulse 86  Temp(Src) 98.3 F (36.8 C) (Oral)  Wt 244 lb 12.8 oz (111.041 kg)  Wt Readings from Last 3 Encounters:  05/30/14 244 lb 12.8 oz (111.041 kg)  05/01/14 234 lb 12 oz (106.482 kg)  03/14/13 235 lb 8 oz (106.822 kg)    Physical Exam  Constitutional: He appears well-developed and well-nourished. No distress.  HENT:  Head: Normocephalic and atraumatic.  Mouth/Throat: Oropharynx is clear and moist. No oropharyngeal exudate.  Eyes: Conjunctivae and EOM are normal. Pupils are equal, round, and reactive to light. No scleral icterus.  Neck: Normal range of motion. Neck supple. Carotid bruit is not present. No  thyromegaly present.  Cardiovascular: Normal rate, regular rhythm, normal heart sounds and intact distal pulses.   No murmur heard. Pulmonary/Chest: Breath sounds normal. No respiratory distress. He has no wheezes. He has no rales.  Abdominal: Soft. There is no tenderness.  No abd/renal bruits  Skin: Skin is warm and dry. No rash noted.  Vitals reviewed.  Results for orders placed or performed in visit on 05/01/14  Lipid panel  Result Value Ref Range   Cholesterol 220 (H) 0 - 200 mg/dL   Triglycerides 409.8 0.0 - 149.0 mg/dL   HDL 11.91 >47.82 mg/dL   VLDL 95.6 0.0 - 21.3 mg/dL   LDL Cholesterol 086 (H) 0 - 99 mg/dL   Total CHOL/HDL Ratio 4    NonHDL 158.30   Comprehensive metabolic panel  Result Value Ref Range   Sodium 136 135 - 145 mEq/L   Potassium 4.7 3.5 - 5.1 mEq/L   Chloride 100 96 - 112 mEq/L   CO2 31 19 - 32 mEq/L   Glucose, Bld 98 70 - 99 mg/dL   BUN 13 6 - 23 mg/dL   Creatinine, Ser 5.78 0.40 - 1.50 mg/dL   Total Bilirubin 0.7 0.2 - 1.2 mg/dL   Alkaline Phosphatase 50 39 - 117 U/L   AST 21 0 - 37 U/L   ALT 33 0 - 53 U/L   Total Protein 7.7 6.0 - 8.3 g/dL   Albumin 4.7 3.5 - 5.2 g/dL   Calcium 9.7 8.4 - 46.9 mg/dL  GFR 105.80 >60.00 mL/min  TSH  Result Value Ref Range   TSH 0.85 0.35 - 4.50 uIU/mL  CBC with Differential/Platelet  Result Value Ref Range   WBC 5.5 4.0 - 10.5 K/uL   RBC 6.00 (H) 4.22 - 5.81 Mil/uL   Hemoglobin 17.0 13.0 - 17.0 g/dL   HCT 62.948.9 52.839.0 - 41.352.0 %   MCV 81.6 78.0 - 100.0 fl   MCHC 34.8 30.0 - 36.0 g/dL   RDW 24.414.0 01.011.5 - 27.215.5 %   Platelets 127.0 (L) 150.0 - 400.0 K/uL   Neutrophils Relative % 69.6 43.0 - 77.0 %   Lymphocytes Relative 21.5 12.0 - 46.0 %   Monocytes Relative 7.7 3.0 - 12.0 %   Eosinophils Relative 0.7 0.0 - 5.0 %   Basophils Relative 0.5 0.0 - 3.0 %   Neutro Abs 3.9 1.4 - 7.7 K/uL   Lymphs Abs 1.2 0.7 - 4.0 K/uL   Monocytes Absolute 0.4 0.1 - 1.0 K/uL   Eosinophils Absolute 0.0 0.0 - 0.7 K/uL   Basophils  Absolute 0.0 0.0 - 0.1 K/uL      Assessment & Plan:   Problem List Items Addressed This Visit    Hypertension, essential - Primary    No indications of secondary cause. Increase amlodipine to 10mg  daily. Update with effect of higher dose in 2 wks. Baseline EKG today. Return in 3-4 mo for f/u. Sooner if needed. Discussed low sodium diet. Pt agrees with plan.  EKG - NSR rate 75, normal axis, intervals, no acute ST/T changes, ?LVH      Relevant Medications   amLODipine (NORVASC) 10 MG tablet   Other Relevant Orders   EKG 12-Lead (Completed)       Follow up plan: Return if symptoms worsen or fail to improve, for follow up visit.

## 2014-05-30 NOTE — Patient Instructions (Addendum)
EKG today. Increase amlodipine to 10mg  daily. Call me in 2 weeks with effect of higher dose of medicine. Goal blood pressure >140/90. Return in 3-4 months for follow up, sooner if needed

## 2014-05-30 NOTE — Assessment & Plan Note (Addendum)
No indications of secondary cause. Increase amlodipine to 10mg  daily. Update with effect of higher dose in 2 wks. Baseline EKG today. Return in 3-4 mo for f/u. Sooner if needed. Discussed low sodium diet. Pt agrees with plan.  EKG - NSR rate 75, normal axis, intervals, no acute ST/T changes, ?LVH

## 2014-09-27 ENCOUNTER — Encounter: Payer: Self-pay | Admitting: Family Medicine

## 2014-09-27 ENCOUNTER — Ambulatory Visit (INDEPENDENT_AMBULATORY_CARE_PROVIDER_SITE_OTHER): Payer: Federal, State, Local not specified - PPO | Admitting: Family Medicine

## 2014-09-27 VITALS — BP 138/88 | HR 73 | Temp 98.0°F | Wt 239.5 lb

## 2014-09-27 DIAGNOSIS — I10 Essential (primary) hypertension: Secondary | ICD-10-CM | POA: Diagnosis not present

## 2014-09-27 DIAGNOSIS — E669 Obesity, unspecified: Secondary | ICD-10-CM | POA: Diagnosis not present

## 2014-09-27 NOTE — Progress Notes (Signed)
Pre visit review using our clinic review tool, if applicable. No additional management support is needed unless otherwise documented below in the visit note. 

## 2014-09-27 NOTE — Progress Notes (Signed)
   BP 138/88 mmHg  Pulse 73  Temp(Src) 98 F (36.7 C) (Oral)  Wt 239 lb 8 oz (108.636 kg)  SpO2 99%   CC: 4 mo f/u visit  Subjective:    Patient ID: Mike Knight, male    DOB: 06/20/1970, 44 y.o.   MRN: [id number]  HPI: Yazen Rosko is a 44 y.o. male presenting on 09/27/2014 for Follow-up   HTN - Compliant with current antihypertensive regimen of amlodipine  daily.  Denies pedal edema. Does check blood pressures at home: 130-140 systolic.  No low blood pressure readings or symptoms of dizziness/syncope.  Denies HA, vision changes, CP/tightness, SOB, leg swelling.    Increasing exercise. Has been working out with son in 8th grade. Trying to change diet. Pt aware of mediterranean diet and DASH diet.  Relevant past medical, surgical, family and social history reviewed and updated as indicated. Interim medical history since our last visit reviewed. Allergies and medications reviewed and updated. Current Outpatient Prescriptions on File Prior to Visit  Medication Sig  . amLODipine (NORVASC) 10 MG tablet Take 1 tablet (10 mg total) by mouth daily.  Marland Kitchen ibuprofen (ADVIL,MOTRIN) 800 MG tablet Take 800 mg by mouth every 8 (eight) hours as needed.   . Multiple Vitamins-Minerals (MULTIVITAMIN PO) Take 1 tablet by mouth daily.   No current facility-administered medications on file prior to visit.    Review of Systems Per HPI unless specifically indicated above     Objective:    BP 138/88 mmHg  Pulse 73  Temp(Src) 98 F (36.7 C) (Oral)  Wt 239 lb 8 oz (108.636 kg)  SpO2 99%  Wt Readings from Last 3 Encounters:  09/27/14 239 lb 8 oz (108.636 kg)  05/30/14 244 lb 12.8 oz (111.041 kg)  05/01/14 234 lb 12 oz (106.482 kg)   Body mass index is 31.17 kg/(m^2).  Physical Exam  Constitutional: He appears well-developed and well-nourished. No distress.  HENT:  Mouth/Throat: Oropharynx is clear and moist. No oropharyngeal exudate.  Cardiovascular: Normal rate, regular rhythm, normal  heart sounds and intact distal pulses.   No murmur heard. Pulmonary/Chest: Effort normal and breath sounds normal. No respiratory distress. He has no wheezes. He has no rales.  Musculoskeletal: He exhibits no edema.  Skin: Skin is warm and dry. No rash noted.  Psychiatric: He has a normal mood and affect.  Nursing note and vitals reviewed.      Assessment & Plan:   Problem List Items Addressed This Visit    Hypertension, essential - Primary    Chronic, stable. Continue amlodipine  daily. Pt tolerating well. Reviewed healthy diet and lifestyle changes to keep bp under control.      Obesity, Class I, BMI 30-34.9    Reviewed healthy diet and lifestyle changes to affect sustainable weight loss.          Follow up plan: Return in about 7 months (around 05/01/2015), or as needed, for annual exam, prior fasting for blood work.

## 2014-09-27 NOTE — Assessment & Plan Note (Signed)
Chronic, stable. Continue amlodipine  daily. Pt tolerating well. Reviewed healthy diet and lifestyle changes to keep bp under control.

## 2014-09-27 NOTE — Patient Instructions (Signed)
Blood pressure is looking better. Continue healthy changes as up to now. Return for physical after 3/16.

## 2014-09-27 NOTE — Assessment & Plan Note (Signed)
Reviewed healthy diet and lifestyle changes to affect sustainable weight loss.  

## 2015-05-15 ENCOUNTER — Encounter: Payer: Self-pay | Admitting: Family Medicine

## 2015-05-15 ENCOUNTER — Ambulatory Visit (INDEPENDENT_AMBULATORY_CARE_PROVIDER_SITE_OTHER): Payer: Federal, State, Local not specified - PPO | Admitting: Family Medicine

## 2015-05-15 VITALS — BP 130/90 | HR 85 | Temp 98.2°F | Ht 73.5 in | Wt 249.5 lb

## 2015-05-15 DIAGNOSIS — M545 Low back pain, unspecified: Secondary | ICD-10-CM

## 2015-05-15 MED ORDER — TRAMADOL HCL 50 MG PO TABS: 50.0000 mg | ORAL_TABLET | Freq: Four times a day (QID) | ORAL | Status: DC | PRN

## 2015-05-15 MED ORDER — DICLOFENAC SODIUM 75 MG PO TBEC
75.0000 mg | DELAYED_RELEASE_TABLET | Freq: Two times a day (BID) | ORAL | Status: DC
Start: 2015-05-15 — End: 2017-03-01

## 2015-05-15 MED ORDER — CYCLOBENZAPRINE HCL 10 MG PO TABS: 10.0000 mg | ORAL_TABLET | Freq: Three times a day (TID) | ORAL | Status: DC | PRN

## 2015-05-15 NOTE — Progress Notes (Signed)
Pre visit review using our clinic review tool, if applicable. No additional management support is needed unless otherwise documented below in the visit note. 
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
## 2015-06-15 ENCOUNTER — Other Ambulatory Visit: Payer: Self-pay | Admitting: Family Medicine

## 2015-08-28 ENCOUNTER — Other Ambulatory Visit: Payer: Self-pay | Admitting: Family Medicine

## 2015-09-10 ENCOUNTER — Other Ambulatory Visit: Payer: Self-pay | Admitting: Family Medicine

## 2015-09-10 NOTE — Telephone Encounter (Signed)
pts wife called and wanted to ck on status of amlodipine refill; I called pt and advised refill was sent to [facility] [address]. Pt voiced understanding and will keep CPX on 10/14/15.

## 2015-09-10 NOTE — Telephone Encounter (Signed)
Looks like this was denied 08/28/2015 when it shouldn't have been (pt was seen 09/2014 and bp was stable).  Hansel Starling can we review med refill protocol with CMA?

## 2015-09-10 NOTE — Telephone Encounter (Signed)
Patient's wife called to find out about his refill for Amlodopine.  Patient's blood pressure has been running high.  Patient has been out of his medication for the last 2 weeks.  I let patient's wife know patient needed to schedule a physical before he could get a refill.  Patient's wife scheduled physical appointment on 10/14/15.  Patient's wife said the pharmacy told her they hadn't heard from our office and patient didn't know he needed to schedule a physical.  Can medication be called in to patient's pharmacy-[facility].?

## 2015-10-14 ENCOUNTER — Ambulatory Visit (INDEPENDENT_AMBULATORY_CARE_PROVIDER_SITE_OTHER): Payer: Federal, State, Local not specified - PPO | Admitting: Family Medicine

## 2015-10-14 ENCOUNTER — Encounter: Payer: Self-pay | Admitting: Family Medicine

## 2015-10-14 VITALS — BP 126/88 | HR 80 | Temp 98.0°F | Wt 244.5 lb

## 2015-10-14 DIAGNOSIS — E66811 Obesity, class 1: Secondary | ICD-10-CM

## 2015-10-14 DIAGNOSIS — E669 Obesity, unspecified: Secondary | ICD-10-CM

## 2015-10-14 DIAGNOSIS — Z87891 Personal history of nicotine dependence: Secondary | ICD-10-CM

## 2015-10-14 DIAGNOSIS — Z0000 Encounter for general adult medical examination without abnormal findings: Secondary | ICD-10-CM

## 2015-10-14 DIAGNOSIS — I10 Essential (primary) hypertension: Secondary | ICD-10-CM

## 2015-10-14 DIAGNOSIS — D693 Immune thrombocytopenic purpura: Secondary | ICD-10-CM

## 2015-10-14 DIAGNOSIS — Z125 Encounter for screening for malignant neoplasm of prostate: Secondary | ICD-10-CM

## 2015-10-14 DIAGNOSIS — E785 Hyperlipidemia, unspecified: Secondary | ICD-10-CM

## 2015-10-14 NOTE — Assessment & Plan Note (Signed)
Deteriorated as of last check - rpt when returns fasting.

## 2015-10-14 NOTE — Patient Instructions (Addendum)
Return at your convenience for fasting labwork.  Work on exercise routine.  You are doing well today. Return as needed or in 1 year for next physical.  Health Maintenance, Male A healthy lifestyle and preventative care can promote health and wellness.  Maintain regular health, dental, and eye exams.  Eat a healthy diet. Foods like vegetables, fruits, whole grains, low-fat dairy products, and lean protein foods contain the nutrients you need and are low in calories. Decrease your intake of foods high in solid fats, added sugars, and salt. Get information about a proper diet from your health care provider, if necessary.  Regular physical exercise is one of the most important things you can do for your health. Most adults should get at least 150 minutes of moderate-intensity exercise (any activity that increases your heart rate and causes you to sweat) each week. In addition, most adults need muscle-strengthening exercises on 2 or more days a week.   Maintain a healthy weight. The body mass index (BMI) is a screening tool to identify possible weight problems. It provides an estimate of body fat based on height and weight. Your health care provider can find your BMI and can help you achieve or maintain a healthy weight. For males 20 years and older:  A BMI below 18.5 is considered underweight.  A BMI of 18.5 to 24.9 is normal.  A BMI of 25 to 29.9 is considered overweight.  A BMI of 30 and above is considered obese.  Maintain normal blood lipids and cholesterol by exercising and minimizing your intake of saturated fat. Eat a balanced diet with plenty of fruits and vegetables. Blood tests for lipids and cholesterol should begin at age 45 and be repeated every 5 years. If your lipid or cholesterol levels are high, you are over age 45, or you are at high risk for heart disease, you may need your cholesterol levels checked more frequently.Ongoing high lipid and cholesterol levels should be treated  with medicines if diet and exercise are not working.  If you smoke, find out from your health care provider how to quit. If you do not use tobacco, do not start.  Lung cancer screening is recommended for adults aged 55-80 years who are at high risk for developing lung cancer because of a history of smoking. A yearly low-dose CT scan of the lungs is recommended for people who have at least a 30-pack-year history of smoking and are current smokers or have quit within the past 15 years. A pack year of smoking is smoking an average of 1 pack of cigarettes a day for 1 year (for example, a 30-pack-year history of smoking could mean smoking 1 pack a day for 30 years or 2 packs a day for 15 years). Yearly screening should continue until the smoker has stopped smoking for at least 15 years. Yearly screening should be stopped for people who develop a health problem that would prevent them from having lung cancer treatment.  If you choose to drink alcohol, do not have more than 2 drinks per day. One drink is considered to be 12 oz (360 mL) of beer, 5 oz (150 mL) of wine, or 1.5 oz (45 mL) of liquor.  Avoid the use of street drugs. Do not share needles with anyone. Ask for help if you need support or instructions about stopping the use of drugs.  High blood pressure causes heart disease and increases the risk of stroke. High blood pressure is more likely to develop in:  People who have blood pressure in the end of the normal range (100-139/85-89 mm Hg).  People who are overweight or obese.  People who are African American.  If you are 97-54 years of age, have your blood pressure checked every 3-5 years. If you are 52 years of age or older, have your blood pressure checked every year. You should have your blood pressure measured twice--once when you are at a hospital or clinic, and once when you are not at a hospital or clinic. Record the average of the two measurements. To check your blood pressure when you  are not at a hospital or clinic, you can use:  An automated blood pressure machine at a pharmacy.  A home blood pressure monitor.  If you are 90-24 years old, ask your health care provider if you should take aspirin to prevent heart disease.  Diabetes screening involves taking a blood sample to check your fasting blood sugar level. This should be done once every 3 years after age 30 if you are at a normal weight and without risk factors for diabetes. Testing should be considered at a younger age or be carried out more frequently if you are overweight and have at least 1 risk factor for diabetes.  Colorectal cancer can be detected and often prevented. Most routine colorectal cancer screening begins at the age of 69 and continues through age 39. However, your health care provider may recommend screening at an earlier age if you have risk factors for colon cancer. On a yearly basis, your health care provider may provide home test kits to check for hidden blood in the stool. A small camera at the end of a tube may be used to directly examine the colon (sigmoidoscopy or colonoscopy) to detect the earliest forms of colorectal cancer. Talk to your health care provider about this at age 39 when routine screening begins. A direct exam of the colon should be repeated every 5-10 years through age 63, unless early forms of precancerous polyps or small growths are found.  People who are at an increased risk for hepatitis B should be screened for this virus. You are considered at high risk for hepatitis B if:  You were born in a country where hepatitis B occurs often. Talk with your health care provider about which countries are considered high risk.  Your parents were born in a high-risk country and you have not received a shot to protect against hepatitis B (hepatitis B vaccine).  You have HIV or AIDS.  You use needles to inject street drugs.  You live with, or have sex with, someone who has hepatitis  B.  You are a man who has sex with other men (MSM).  You get hemodialysis treatment.  You take certain medicines for conditions like cancer, organ transplantation, and autoimmune conditions.  Hepatitis C blood testing is recommended for all people born from [birth year] through 1965 and any individual with known risk factors for hepatitis C.  Healthy men should no longer receive prostate-specific antigen (PSA) blood tests as part of routine cancer screening. Talk to your health care provider about prostate cancer screening.  Testicular cancer screening is not recommended for adolescents or adult males who have no symptoms. Screening includes self-exam, a health care provider exam, and other screening tests. Consult with your health care provider about any symptoms you have or any concerns you have about testicular cancer.  Practice safe sex. Use condoms and avoid high-risk sexual practices to reduce the spread of  sexually transmitted infections (STIs).  You should be screened for STIs, including gonorrhea and chlamydia if:  You are sexually active and are younger than 24 years.  You are older than 24 years, and your health care provider tells you that you are at risk for this type of infection.  Your sexual activity has changed since you were last screened, and you are at an increased risk for chlamydia or gonorrhea. Ask your health care provider if you are at risk.  If you are at risk of being infected with HIV, it is recommended that you take a prescription medicine daily to prevent HIV infection. This is called pre-exposure prophylaxis (PrEP). You are considered at risk if:  You are a man who has sex with other men (MSM).  You are a heterosexual man who is sexually active with multiple partners.  You take drugs by injection.  You are sexually active with a partner who has HIV.  Talk with your health care provider about whether you are at high risk of being infected with HIV. If you  choose to begin PrEP, you should first be tested for HIV. You should then be tested every 3 months for as long as you are taking PrEP.  Use sunscreen. Apply sunscreen liberally and repeatedly throughout the day. You should seek shade when your shadow is shorter than you. Protect yourself by wearing long sleeves, pants, a wide-brimmed hat, and sunglasses year round whenever you are outdoors.  Tell your health care provider of new moles or changes in moles, especially if there is a change in shape or color. Also, tell your health care provider if a mole is larger than the size of a pencil eraser.  A one-time screening for abdominal aortic aneurysm (AAA) and surgical repair of large AAAs by ultrasound is recommended for men aged 68-75 years who are current or former smokers.  Stay current with your vaccines (immunizations).   This information is not intended to replace advice given to you by your health care provider. Make sure you discuss any questions you have with your health care provider.   Document Released: 07/31/2007 Document Revised: 02/22/2014 Document Reviewed: 06/29/2010 Elsevier Interactive Patient Education [employer].

## 2015-10-14 NOTE — Assessment & Plan Note (Signed)
Preventative protocols reviewed and updated unless pt declined. Discussed healthy diet and lifestyle.  

## 2015-10-14 NOTE — Progress Notes (Signed)
Pre visit review using our clinic review tool, if applicable. No additional management support is needed unless otherwise documented below in the visit note. 

## 2015-10-14 NOTE — Assessment & Plan Note (Signed)
Chronic. Check CBC when returns.

## 2015-10-14 NOTE — Assessment & Plan Note (Signed)
Chronic, stable.  Continue amlodipine 10 mg daily 

## 2015-10-14 NOTE — Assessment & Plan Note (Signed)
Discussed healthy diet and lifestyle changes to affect sustainable weight loss  

## 2015-10-14 NOTE — Progress Notes (Signed)
BP 126/88   Pulse 80   Temp 98 F (36.7 C) (Oral)   Wt 244 lb 8 oz (110.9 kg)   BMI 31.82 kg/m    CC: CPE Subjective:    Patient ID: Mike Knight, male    DOB: 04/06/1970, 45 y.o.   MRN: [id number]  HPI: Mike BibleDennis Sotomayor is a 45 y.o. male presenting on 10/14/2015 for Annual Exam   Not fasting today.   Preventative: Prostate cancer screening - father dx at age 45 yo of prostate cancer. start screening today. Flu - declines Tdap - 09/2012 Seat belt use discussed Sunscreen use discussed. No changing moles on skin.  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 Interim medical history since our last visit reviewed. Allergies and medications reviewed and updated. Current Outpatient Prescriptions on File Prior to Visit  Medication Sig  . amLODipine (NORVASC) 10 MG tablet TAKE 1 TABLET BY MOUTH EVERY DAY  . Multiple Vitamins-Minerals (MULTIVITAMIN PO) Take 1 tablet by mouth daily.  . cyclobenzaprine (FLEXERIL) 10 MG tablet Take 1 tablet (10 mg total) by mouth 3 (three) times daily as needed for muscle spasms. (Patient not taking: Reported on 10/14/2015)  . diclofenac (VOLTAREN) 75 MG EC tablet Take 1 tablet (75 mg total) by mouth 2 (two) times daily. (Patient not taking: Reported on 10/14/2015)  . traMADol (ULTRAM) 50 MG tablet Take 1 tablet (50 mg total) by mouth every 6 (six) hours as needed. (Patient not taking: Reported on 10/14/2015)   No current facility-administered medications on file prior to visit.     Review of Systems  Constitutional: Negative for activity change, appetite change, chills, fatigue, fever and unexpected weight change.  HENT: Negative for hearing loss.   Eyes: Negative for visual disturbance.    Respiratory: Negative for cough, chest tightness, shortness of breath and wheezing.   Cardiovascular: Negative for chest pain, palpitations and leg swelling.  Gastrointestinal: Negative for abdominal distention, abdominal pain, blood in stool, constipation, diarrhea, nausea and vomiting.  Genitourinary: Negative for difficulty urinating and hematuria.  Musculoskeletal: Negative for arthralgias, myalgias and neck pain.  Skin: Negative for rash.  Neurological: Negative for dizziness, seizures, syncope and headaches.  Hematological: Negative for adenopathy. Does not bruise/bleed easily.  Psychiatric/Behavioral: Negative for dysphoric mood. The patient is not nervous/anxious.    Per HPI unless specifically indicated in ROS section     Objective:    BP 126/88   Pulse 80   Temp 98 F (36.7 C) (Oral)   Wt 244 lb 8 oz (110.9 kg)   BMI 31.82 kg/m   Wt Readings from Last 3 Encounters:  10/14/15 244 lb 8 oz (110.9 kg)  05/15/15 249 lb 8 oz (113.2 kg)  09/27/14 239 lb 8 oz (108.6 kg)    Physical Exam  Constitutional: He is oriented to person, place, and time. He appears well-developed and well-nourished. No distress.  HENT:  Head: Normocephalic and atraumatic.  Right Ear: Hearing, tympanic membrane, external ear and ear canal normal.  Left Ear: Hearing, tympanic membrane, external ear and ear canal normal.  Nose: Nose normal.  Mouth/Throat: Uvula is midline, oropharynx is clear and moist and mucous membranes are normal. No oropharyngeal exudate, posterior oropharyngeal edema or posterior oropharyngeal erythema.  Eyes: Conjunctivae and EOM are normal. Pupils are equal,  round, and reactive to light. No scleral icterus.  Neck: Normal range of motion. Neck supple. No thyromegaly present.  Cardiovascular: Normal rate, regular rhythm, normal heart sounds and intact distal pulses.   No murmur heard. Pulses:      Radial pulses are 2+ on the right side, and 2+ on the left side.   Pulmonary/Chest: Effort normal and breath sounds normal. No respiratory distress. He has no wheezes. He has no rales.  Abdominal: Soft. Bowel sounds are normal. He exhibits no distension and no mass. There is no tenderness. There is no rebound and no guarding.  Genitourinary: Rectum normal. Rectal exam shows no external hemorrhoid, no internal hemorrhoid, no fissure, no mass, no tenderness and anal tone normal. Prostate is enlarged (25gm). Prostate is not tender.  Musculoskeletal: Normal range of motion. He exhibits no edema.  Lymphadenopathy:    He has no cervical adenopathy.  Neurological: He is alert and oriented to person, place, and time.  CN grossly intact, station and gait intact  Skin: Skin is warm and dry. No rash noted.  Psychiatric: He has a normal mood and affect. His behavior is normal. Judgment and thought content normal.  Nursing note and vitals reviewed.     Assessment & Plan:   Problem List Items Addressed This Visit    Chronic idiopathic thrombocytopenia (HCC)    Chronic. Check CBC when returns.       Relevant Orders   CBC with Differential/Platelet   Ex-smoker   HLD (hyperlipidemia)    Deteriorated as of last check - rpt when returns fasting.      Relevant Orders   Lipid panel   Hypertension, essential    Chronic, stable. Continue amlodipine 10mg  daily.      Relevant Orders   Basic metabolic panel   Obesity, Class I, BMI 30-34.9    Discussed healthy diet and lifestyle changes to affect sustainable weight loss.       Routine health maintenance - Primary    Preventative protocols reviewed and updated unless pt declined. Discussed healthy diet and lifestyle.        Other Visit Diagnoses    Special screening for malignant neoplasm of prostate       Relevant Orders   PSA       Follow up plan: Return in about 1 year (around 10/13/2016), or as needed, for annual exam, prior fasting for blood work.  Eustaquio Boyden, MD

## 2015-10-28 ENCOUNTER — Other Ambulatory Visit (INDEPENDENT_AMBULATORY_CARE_PROVIDER_SITE_OTHER): Payer: Federal, State, Local not specified - PPO

## 2015-10-28 DIAGNOSIS — E785 Hyperlipidemia, unspecified: Secondary | ICD-10-CM

## 2015-10-28 DIAGNOSIS — D693 Immune thrombocytopenic purpura: Secondary | ICD-10-CM

## 2015-10-28 DIAGNOSIS — Z125 Encounter for screening for malignant neoplasm of prostate: Secondary | ICD-10-CM

## 2015-10-28 DIAGNOSIS — I10 Essential (primary) hypertension: Secondary | ICD-10-CM

## 2015-10-28 LAB — BASIC METABOLIC PANEL
BUN: 14 mg/dL (ref 6–23)
CO2: 29 mEq/L (ref 19–32)
Calcium: 9 mg/dL (ref 8.4–10.5)
Chloride: 101 mEq/L (ref 96–112)
Creatinine, Ser: 0.82 mg/dL (ref 0.40–1.50)
GFR: 130.6 mL/min (ref >60.00)
Glucose, Bld: 95 mg/dL (ref 70–99)
Potassium: 4.6 mEq/L (ref 3.5–5.1)
Sodium: 136 mEq/L (ref 135–145)

## 2015-10-28 LAB — CBC WITH DIFFERENTIAL/PLATELET
Basophils Absolute: 0 10*3/uL (ref 0.0–0.1)
Basophils Relative: 0.6 % (ref 0.0–3.0)
Eosinophils Absolute: 0.3 10*3/uL (ref 0.0–0.7)
Eosinophils Relative: 6.3 % — ABNORMAL HIGH (ref 0.0–5.0)
HCT: 45.4 % (ref 39.0–52.0)
Hemoglobin: 15.7 g/dL (ref 13.0–17.0)
Lymphocytes Relative: 31.1 % (ref 12.0–46.0)
Lymphs Abs: 1.6 10*3/uL (ref 0.7–4.0)
MCHC: 34.6 g/dL (ref 30.0–36.0)
MCV: 81.7 fl (ref 78.0–100.0)
Monocytes Absolute: 0.5 10*3/uL (ref 0.1–1.0)
Monocytes Relative: 10.4 % (ref 3.0–12.0)
Neutro Abs: 2.7 10*3/uL (ref 1.4–7.7)
Neutrophils Relative %: 51.6 % (ref 43.0–77.0)
Platelets: 145 10*3/uL — ABNORMAL LOW (ref 150.0–400.0)
RBC: 5.56 Mil/uL (ref 4.22–5.81)
RDW: 14.3 % (ref 11.5–15.5)
WBC: 5.3 10*3/uL (ref 4.0–10.5)

## 2015-10-28 LAB — LIPID PANEL
Cholesterol: 203 mg/dL — ABNORMAL HIGH (ref 0–200)
HDL: 54.5 mg/dL (ref >39.00)
LDL Cholesterol: 136 mg/dL — ABNORMAL HIGH (ref 0–99)
NonHDL: 148.45
Total CHOL/HDL Ratio: 4
Triglycerides: 62 mg/dL (ref 0.0–149.0)
VLDL: 12.4 mg/dL (ref 0.0–40.0)

## 2015-10-28 LAB — PSA: PSA: 0.92 ng/mL (ref 0.10–4.00)

## 2015-10-29 ENCOUNTER — Encounter: Payer: Self-pay | Admitting: Family Medicine

## 2015-10-29 ENCOUNTER — Encounter: Payer: Self-pay | Admitting: *Deleted

## 2015-11-13 ENCOUNTER — Other Ambulatory Visit: Payer: Self-pay | Admitting: Family Medicine

## 2015-11-13 NOTE — Telephone Encounter (Signed)
Pt calls to ck on status of amlodipine refill; per 10/14/15 annual refilled per protocol. Pt voiced understanding and will ck with pharmacy later today.

## 2016-11-12 ENCOUNTER — Other Ambulatory Visit: Payer: Self-pay | Admitting: Family Medicine

## 2017-03-01 ENCOUNTER — Ambulatory Visit: Payer: Federal, State, Local not specified - PPO | Admitting: Family Medicine

## 2017-03-01 ENCOUNTER — Encounter: Payer: Self-pay | Admitting: Family Medicine

## 2017-03-01 VITALS — BP 148/88 | HR 91 | Temp 98.0°F | Wt 253.5 lb

## 2017-03-01 DIAGNOSIS — I10 Essential (primary) hypertension: Secondary | ICD-10-CM | POA: Diagnosis not present

## 2017-03-01 DIAGNOSIS — J069 Acute upper respiratory infection, unspecified: Secondary | ICD-10-CM | POA: Diagnosis not present

## 2017-03-01 DIAGNOSIS — B9789 Other viral agents as the cause of diseases classified elsewhere: Secondary | ICD-10-CM | POA: Diagnosis not present

## 2017-03-01 LAB — POC INFLUENZA A&B (BINAX/QUICKVUE)
Influenza A, POC: NEGATIVE
Influenza B, POC: NEGATIVE

## 2017-03-01 MED ORDER — GUAIFENESIN-CODEINE 100-10 MG/5ML PO SYRP: 5.0000 mL | ORAL_SOLUTION | Freq: Two times a day (BID) | ORAL | 0 refills | Status: DC | PRN

## 2017-03-01 NOTE — Assessment & Plan Note (Addendum)
Overdue for f/u. Endorsing some elevated readings at home. Reviewed correct collection of blood pressure.  Reviewed low sodium diet and importance of aerobic exercise. Reassess when returns in next few months for CPE with labs.

## 2017-03-01 NOTE — Patient Instructions (Signed)
You have a viral upper respiratory infection. Antibiotics are not needed for this.  Viral infections usually take 7-10 days to resolve.  The cough can last a few weeks to go away. Use medication as prescribed: cheratussin  Tylenol 500mg  with meals as needed for discomfort Push fluids and plenty of rest. Please return if you are not improving as expected, or if you have high fevers (>101.5) or difficulty swallowing or worsening productive cough. Call clinic with questions.  Good to see you today. I hope you start feeling better soon.

## 2017-03-01 NOTE — Assessment & Plan Note (Signed)
Flu swab negative today Anticipate viral given short duration Supportive care reviewed Update if not improving with treatment. Pt agrees with plan.

## 2017-03-01 NOTE — Progress Notes (Signed)
BP (!) 148/88 (BP Location: Right Arm, Cuff Size: Large)   Pulse 91   Temp 98 F (36.7 C) (Oral)   Wt 253 lb 8 oz (115 kg)   SpO2 95%   BMI 32.99 kg/m    CC: sinus Subjective:    Patient ID: Mike Bibleennis Knight, male    DOB: 01/18/1971, 47 y.o.   MRN: [id number]  HPI: Mike BibleDennis Komatsu is a 47 y.o. male presenting on 03/01/2017 for Sinus Problem (Started last night. Severe sinus congestion/pressure causing difficulty sleeping. Elevated BP 2 days ago, 182/104 but happens often.)   1d h/o sinus headache, sore throat, loss of appetite. Out of work today. Mild cough last night. Trouble breathing from congestion.   No fevers/chills, ear or tooth pain, or wheezing.  Has tried pseudophed which didn't help.  No sick contacts at home.  Non smoker No h/o asthma Newborn at home (SIL's child)  BP has been running elevated - high. 180/100s. Tends to be higher after stressful day at work. Asxs.  Relevant past medical, surgical, family and social history reviewed and updated as indicated. Interim medical history since our last visit reviewed. Allergies and medications reviewed and updated. Outpatient Medications Prior to Visit  Medication Sig Dispense Refill  . amLODipine (NORVASC) 10 MG tablet TAKE 1 TABLET BY MOUTH EVERY DAY 30 tablet 11  . cyclobenzaprine (FLEXERIL) 10 MG tablet Take 1 tablet (10 mg total) by mouth 3 (three) times daily as needed for muscle spasms. 30 tablet 0  . diclofenac (VOLTAREN) 75 MG EC tablet Take 1 tablet (75 mg total) by mouth 2 (two) times daily. 60 tablet 0  . Multiple Vitamins-Minerals (MULTIVITAMIN PO) Take 1 tablet by mouth daily.    . traMADol (ULTRAM) 50 MG tablet Take 1 tablet (50 mg total) by mouth every 6 (six) hours as needed. 40 tablet 0   No facility-administered medications prior to visit.      Per HPI unless specifically indicated in ROS section below Review of Systems     Objective:    BP (!) 148/88 (BP Location: Right Arm, Cuff Size: Large)    Pulse 91   Temp 98 F (36.7 C) (Oral)   Wt 253 lb 8 oz (115 kg)   SpO2 95%   BMI 32.99 kg/m   Wt Readings from Last 3 Encounters:  03/01/17 253 lb 8 oz (115 kg)  10/14/15 244 lb 8 oz (110.9 kg)  05/15/15 249 lb 8 oz (113.2 kg)    Physical Exam  Constitutional: He appears well-developed and well-nourished. No distress.  HENT:  Head: Normocephalic and atraumatic.  Right Ear: Hearing, tympanic membrane, external ear and ear canal normal.  Left Ear: Hearing, tympanic membrane, external ear and ear canal normal.  Nose: Mucosal edema (nasal mucosal congestion) and rhinorrhea present. Right sinus exhibits no maxillary sinus tenderness and no frontal sinus tenderness. Left sinus exhibits no maxillary sinus tenderness and no frontal sinus tenderness.  Mouth/Throat: Uvula is midline, oropharynx is clear and moist and mucous membranes are normal. No oropharyngeal exudate, posterior oropharyngeal edema, posterior oropharyngeal erythema or tonsillar abscesses.  Eyes: Conjunctivae and EOM are normal. Pupils are equal, round, and reactive to light. No scleral icterus.  Neck: Normal range of motion. Neck supple.  Cardiovascular: Normal rate, regular rhythm, normal heart sounds and intact distal pulses.  No murmur heard. Pulmonary/Chest: Effort normal and breath sounds normal. No respiratory distress. He has no wheezes. He has no rales.  Lungs clear  Lymphadenopathy:    He  has no cervical adenopathy.  Skin: Skin is warm and dry. No rash noted.  Nursing note and vitals reviewed.  Results for orders placed or performed in visit on 03/01/17  POC Influenza A&B(BINAX/QUICKVUE)  Result Value Ref Range   Influenza A, POC Negative Negative   Influenza B, POC Negative Negative      Assessment & Plan:  I've asked patient to return for physical as overdue.  Problem List Items Addressed This Visit    Hypertension, essential    Overdue for f/u. Endorsing some elevated readings at home. Reviewed correct  collection of blood pressure.  Reviewed low sodium diet and importance of aerobic exercise. Reassess when returns in next few months for CPE with labs.       Viral URI with cough - Primary    Flu swab negative today Anticipate viral given short duration Supportive care reviewed Update if not improving with treatment. Pt agrees with plan.       Relevant Orders   POC Influenza A&B(BINAX/QUICKVUE) (Completed)       Follow up plan: Return in about 2 months (around 04/29/2017) for annual exam, prior fasting for blood work.  Eustaquio Boyden, MD

## 2017-09-02 DIAGNOSIS — R5381 Other malaise: Secondary | ICD-10-CM | POA: Diagnosis not present

## 2017-09-19 ENCOUNTER — Encounter: Payer: Self-pay | Admitting: Family Medicine

## 2017-09-19 ENCOUNTER — Ambulatory Visit: Payer: Federal, State, Local not specified - PPO | Admitting: Family Medicine

## 2017-09-19 VITALS — BP 136/100 | HR 85 | Temp 97.8°F | Ht 74.0 in | Wt 258.2 lb

## 2017-09-19 DIAGNOSIS — E669 Obesity, unspecified: Secondary | ICD-10-CM | POA: Diagnosis not present

## 2017-09-19 DIAGNOSIS — I10 Essential (primary) hypertension: Secondary | ICD-10-CM | POA: Diagnosis not present

## 2017-09-19 LAB — BASIC METABOLIC PANEL
BUN: 12 mg/dL (ref 6–23)
CO2: 28 mEq/L (ref 19–32)
Calcium: 9.7 mg/dL (ref 8.4–10.5)
Chloride: 102 mEq/L (ref 96–112)
Creatinine, Ser: 0.96 mg/dL (ref 0.40–1.50)
GFR: 107.97 mL/min (ref >60.00)
Glucose, Bld: 116 mg/dL — ABNORMAL HIGH (ref 70–99)
Potassium: 4.4 mEq/L (ref 3.5–5.1)
Sodium: 138 mEq/L (ref 135–145)

## 2017-09-19 MED ORDER — HYDROCHLOROTHIAZIDE 12.5 MG PO CAPS
12.5000 mg | ORAL_CAPSULE | Freq: Every day | ORAL | 1 refills | Status: DC
Start: 2017-09-19 — End: 2017-09-19

## 2017-09-19 MED ORDER — HYDROCHLOROTHIAZIDE 12.5 MG PO CAPS: 12.5000 mg | ORAL_CAPSULE | Freq: Every day | ORAL | 1 refills | Status: DC

## 2017-09-19 MED ORDER — AMLODIPINE BESYLATE 10 MG PO TABS: 10.0000 mg | ORAL_TABLET | Freq: Every day | ORAL | 1 refills | Status: DC

## 2017-09-19 NOTE — Assessment & Plan Note (Addendum)
Chronic, not at goal. Weight gain noted.  Continue amlodipine.  Reviewed healthy diet and lifestyle changes to maintain good blood pressure control.  Will Rx HCTZ 12.5mg  and he will fill if persistently elevated diastolics over next few weeks.  BMP today. RTC 6 mo CPE.

## 2017-09-19 NOTE — Patient Instructions (Addendum)
You are doing well today - congratulations on healthy diet changes and weight loss - continue this. Avoid salt/sodium in the diet. Work on aerobic exercise routine.  Blood pressures are staying above goal. Continue amlodipine 10mg  daily, add on hydrochlorothiazide if blood pressure remaining >140/90.  Labs today Return in 6 months for physical.

## 2017-09-19 NOTE — Assessment & Plan Note (Signed)
Discussed weight gain noted. Pt has already implemented healthy diet and lifestyle changes for goal weight loss (as per HPI). And has noted 8 lb drop in the last 2 wks. Encouraged continue with changes.

## 2017-09-19 NOTE — Progress Notes (Signed)
BP (!) 136/100 (BP Location: Right Arm, Patient Position: Sitting, Cuff Size: Large)   Pulse 85   Temp 97.8 F (36.6 C) (Oral)   Ht 6\' 2"  (1.88 m)   Wt 258 lb 4 oz (117.1 kg)   SpO2 95%   BMI 33.16 kg/m    CC: elevated blood pressures Subjective:    Patient ID: Mike Knight, male    DOB: 12/27/1970, 47 y.o.   MRN: [id number]  HPI: Mike BibleDennis Amparan is a 47 y.o. male presenting on 09/19/2017 for Elevated BP (C/o elevated BP 1 day about 2 wks ago, 153/101.  Had just gotten off from work and had severe HA. [facility], BP was 118/88. Told he was dehydrated and suggested Pedialyte and rest. )   HTN - Compliant with current antihypertensive regimen of amlodipine 10mg  daily.  Does check blood pressures at home: 120/08s. See below re: dizziness. Denies HA, vision changes, CP/tightness, SOB, leg swelling. Progressive weight gain noted.   [facility] and initial hypertension - dx with dehydration and component of heat exhaustion. No labs drawn. Has felt well since then.   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   Changing diet - eating healthier and staying active - has lost 8 lbs in last 2 weeks. Eating more salads, less bread, eating prepared meals vs out.   Relevant past medical, surgical, family and social history reviewed and updated as indicated. Interim medical history since our last visit reviewed. Allergies and medications reviewed and updated. Outpatient Medications Prior to Visit  Medication Sig Dispense Refill  . guaiFENesin-codeine (CHERATUSSIN AC) 100-10 MG/5ML syrup Take 5 mLs by mouth 2 (two) times daily as needed for cough (sedation precautions). 140 mL 0  . amLODipine (NORVASC) 10 MG tablet TAKE 1 TABLET BY MOUTH EVERY DAY 30 tablet 11   No facility-administered medications prior to visit.      Per HPI unless specifically indicated in ROS section below Review of Systems     Objective:    BP (!) 136/100 (BP Location: Right Arm, Patient  Position: Sitting, Cuff Size: Large)   Pulse 85   Temp 97.8 F (36.6 C) (Oral)   Ht 6\' 2"  (1.88 m)   Wt 258 lb 4 oz (117.1 kg)   SpO2 95%   BMI 33.16 kg/m   Wt Readings from Last 3 Encounters:  09/19/17 258 lb 4 oz (117.1 kg)  03/01/17 253 lb 8 oz (115 kg)  10/14/15 244 lb 8 oz (110.9 kg)    Physical Exam  Constitutional: He appears well-developed and well-nourished. No distress.  HENT:  Mouth/Throat: Oropharynx is clear and moist. No oropharyngeal exudate.  Cardiovascular: Normal rate, regular rhythm and normal heart sounds.  No murmur heard. Pulmonary/Chest: Effort normal and breath sounds normal. No respiratory distress. He has no wheezes. He has no rales.  Musculoskeletal: He exhibits no edema.  Psychiatric: He has a normal mood and affect.  Nursing note and vitals reviewed.  Lab Results  Component Value Date   CREATININE 0.82 10/28/2015   BUN 14 10/28/2015   NA 136 10/28/2015   K 4.6 10/28/2015   CL 101 10/28/2015   CO2 29 10/28/2015      Assessment & Plan:   Problem List Items Addressed This Visit    Obesity, Class I, BMI 30-34.9    Discussed weight gain noted. Pt has already implemented healthy diet and lifestyle changes for goal weight loss (as per HPI). And has noted 8 lb drop  in the last 2 wks. Encouraged continue with changes.       Hypertension, essential - Primary    Chronic, not at goal. Weight gain noted.  Continue amlodipine.  Reviewed healthy diet and lifestyle changes to maintain good blood pressure control.  Will Rx HCTZ 12.5mg  and he will fill if persistently elevated diastolics over next few weeks.  BMP today. RTC 6 mo CPE.       Relevant Medications   amLODipine (NORVASC) 10 MG tablet   hydrochlorothiazide (MICROZIDE) 12.5 MG capsule   Other Relevant Orders   Basic metabolic panel       Meds ordered this encounter  Medications  . DISCONTD: hydrochlorothiazide (MICROZIDE) 12.5 MG capsule    Sig: Take 1 capsule (12.5 mg total) by mouth  daily.    Dispense:  90 capsule    Refill:  1  . amLODipine (NORVASC) 10 MG tablet    Sig: Take 1 tablet (10 mg total) by mouth daily.    Dispense:  90 tablet    Refill:  1  . hydrochlorothiazide (MICROZIDE) 12.5 MG capsule    Sig: Take 1 capsule (12.5 mg total) by mouth daily.    Dispense:  90 capsule    Refill:  1   Orders Placed This Encounter  Procedures  . Basic metabolic panel    Follow up plan: Return in about 6 months (around 03/22/2018) for annual exam, prior fasting for blood work.  Eustaquio Boyden, MD

## 2017-09-20 ENCOUNTER — Other Ambulatory Visit: Payer: Self-pay | Admitting: Family Medicine

## 2018-03-21 ENCOUNTER — Telehealth: Payer: Self-pay | Admitting: Family Medicine

## 2018-03-21 NOTE — Telephone Encounter (Signed)
E-scribed refill.  Please schedule for annual CPE. 

## 2018-03-21 NOTE — Telephone Encounter (Signed)
E-scribed refill.  Please schedule annual CPE. 

## 2018-03-22 DIAGNOSIS — R112 Nausea with vomiting, unspecified: Secondary | ICD-10-CM | POA: Diagnosis not present

## 2018-03-27 NOTE — Telephone Encounter (Signed)
Labs 4/17 cpx 4/20 Pt aware

## 2018-03-27 NOTE — Telephone Encounter (Signed)
Noted  

## 2018-03-27 NOTE — Telephone Encounter (Signed)
cpx labs 4/17 cpx 4/20 Pt aware

## 2018-06-02 ENCOUNTER — Other Ambulatory Visit: Payer: Federal, State, Local not specified - PPO

## 2018-06-05 ENCOUNTER — Encounter: Payer: Federal, State, Local not specified - PPO | Admitting: Family Medicine

## 2018-06-20 ENCOUNTER — Encounter: Payer: Self-pay | Admitting: Family Medicine

## 2018-06-20 ENCOUNTER — Ambulatory Visit (INDEPENDENT_AMBULATORY_CARE_PROVIDER_SITE_OTHER): Payer: Federal, State, Local not specified - PPO | Admitting: Family Medicine

## 2018-06-20 ENCOUNTER — Telehealth: Payer: Self-pay | Admitting: Family Medicine

## 2018-06-20 DIAGNOSIS — M545 Low back pain, unspecified: Secondary | ICD-10-CM

## 2018-06-20 MED ORDER — CYCLOBENZAPRINE HCL 10 MG PO TABS: 5.0000 mg | ORAL_TABLET | Freq: Two times a day (BID) | ORAL | 0 refills | Status: DC | PRN

## 2018-06-20 MED ORDER — TRAMADOL HCL 50 MG PO TABS: 50.0000 mg | ORAL_TABLET | Freq: Three times a day (TID) | ORAL | 0 refills | Status: AC | PRN

## 2018-06-20 NOTE — Telephone Encounter (Signed)
Called pt scheduled appt for 06/20/18 @ 11am

## 2018-06-20 NOTE — Patient Instructions (Signed)
Sounds like you have developed a lumbar strain.  Treat with gentle stretching, rest, ice or heating pad to lower back (not directly on the skin for 10 min at a time), continue aleve. I have refilled flexeril and tramadol for you - caution with sedation when taking these medications. Let us know if not improving with treatment. I'm glad blood pressures are doing better.

## 2018-06-20 NOTE — Progress Notes (Signed)
Virtual visit completed through [employer]. Due to national recommendations of social distancing due to COVID 19, a virtual visit is felt to be most appropriate for this patient at this time.   Patient location: home Provider location: [place] [facility], office If any vitals were documented, they were collected by patient at home unless specified below.    BP 120/82 Comment: recent reading at home  Temp 99.1 F (37.3 C) (Oral)   Ht 6\' 2"  (1.88 m)   Wt 256 lb (116.1 kg)   BMI 32.87 kg/m    CC: lower back pain Subjective:    Patient ID: Mike Knight, male    DOB: 09/02/1970, 48 y.o.   MRN: [id number]  HPI: Mike Knight is a 48 y.o. male presenting on 06/20/2018 for Back Pain (C/o low back pain, bilateral. Started 06/17/18. Denies injury. )   3d h/o bilateral lower back pain that started after work over weekend without inciting trauma/injury. Describes spasm/dull ache to lower back without radiation.   No fevers/chills, shooting pain down legs, bowel/bladder accidents, numbness/weakness.   Has been using flexeril as well as aleve 220mg  bid with limited benefit. About to run out of flexeril as well.   H/o lower back pain in the past last saw Dr Patsy [place] 2017 treated supportively with flexeril, voltaren, tramadol.   HTN - only on amlodipine - bp well controlled recently (wife checks). Latest reading 128/82. Not taking hctz at this time - not needing.       Relevant past medical, surgical, family and social history reviewed and updated as indicated. Interim medical history since our last visit reviewed. Allergies and medications reviewed and updated. Outpatient Medications Prior to Visit  Medication Sig Dispense Refill  . amLODipine (NORVASC) 10 MG tablet TAKE 1 TABLET BY MOUTH EVERY DAY 90 tablet 0  . hydrochlorothiazide (MICROZIDE) 12.5 MG capsule Take 1 capsule (12.5 mg total) by mouth daily. (Patient not taking: Reported on 06/20/2018) 90 capsule 1  . guaiFENesin-codeine  (CHERATUSSIN AC) 100-10 MG/5ML syrup Take 5 mLs by mouth 2 (two) times daily as needed for cough (sedation precautions). 140 mL 0   No facility-administered medications prior to visit.      Per HPI unless specifically indicated in ROS section below Review of Systems Objective:    BP 120/82 Comment: recent reading at home  Temp 99.1 F (37.3 C) (Oral)   Ht 6\' 2"  (1.88 m)   Wt 256 lb (116.1 kg)   BMI 32.87 kg/m   Wt Readings from Last 3 Encounters:  06/20/18 256 lb (116.1 kg)  09/19/17 258 lb 4 oz (117.1 kg)  03/01/17 253 lb 8 oz (115 kg)     Physical exam: Gen: alert, NAD, not ill appearing Pulm: speaks in complete sentences without increased work of breathing Psych: normal mood, normal thought content      Results for orders placed or performed in visit on 09/19/17  Basic metabolic panel  Result Value Ref Range   Sodium 138 135 - 145 mEq/L   Potassium 4.4 3.5 - 5.1 mEq/L   Chloride 102 96 - 112 mEq/L   CO2 28 19 - 32 mEq/L   Glucose, Bld 116 (H) 70 - 99 mg/dL   BUN 12 6 - 23 mg/dL   Creatinine, Ser 4.090.96 0.40 - 1.50 mg/dL   Calcium 9.7 8.4 - 81.110.5 mg/dL   GFR [id number] >29.56>60.00 mL/min   Assessment & Plan:   Problem List Items Addressed This Visit    Lower back pain  Anticipate lumbar strain. No red flags. Supportive care reviewed. Rx short course tramadol and flexeril with sedation precautions. Continue aleve. Further supportive care as per instructions. Will mail handout on lower back pain with preventative exercises.       Relevant Medications   cyclobenzaprine (FLEXERIL) 10 MG tablet   traMADol (ULTRAM) 50 MG tablet       Meds ordered this encounter  Medications  . cyclobenzaprine (FLEXERIL) 10 MG tablet    Sig: Take 0.5-1 tablets (5-10 mg total) by mouth 2 (two) times daily as needed for muscle spasms.    Dispense:  30 tablet    Refill:  0  . traMADol (ULTRAM) 50 MG tablet    Sig: Take 1 tablet (50 mg total) by mouth every 8 (eight) hours as needed for up  to 5 days for moderate pain.    Dispense:  15 tablet    Refill:  0   No orders of the defined types were placed in this encounter.   Patient Instructions  Sounds like you have developed a lumbar strain.  Treat with gentle stretching, rest, ice or heating pad to lower back (not directly on the skin for 10 min at a time), continue aleve. I have refilled flexeril and tramadol for you - caution with sedation when taking these medications. Let us know if not improving with treatment. I'm glad blood pressures are doing better.   Follow up plan: Return if symptoms worsen or fail to improve.  Eustaquio Boyden, MD

## 2018-06-20 NOTE — Assessment & Plan Note (Signed)
Anticipate lumbar strain. No red flags. Supportive care reviewed. Rx short course tramadol and flexeril with sedation precautions. Continue aleve. Further supportive care as per instructions. Will mail handout on lower back pain with preventative exercises.

## 2018-06-20 NOTE — Telephone Encounter (Signed)
Copied from CRM [id number]. Topic: General - Other >> Jun 19, 2018  4:49 PM Herby Abraham C wrote: Reason for CRM: pt called in to schedule a virtual visit for back pain  CB: [phone]

## 2018-06-23 ENCOUNTER — Telehealth: Payer: Self-pay | Admitting: Family Medicine

## 2018-06-23 NOTE — Telephone Encounter (Signed)
Copied from CRM [id number]. Topic: Quick Communication - See Telephone Encounter >> Jun 23, 2018  4:11 PM Jens Som A wrote: CRM for notification. See Telephone encounter for: 06/23/18.Patient's wife is calling because he had a virtual visit on 06/20/18. Patient is requesting a note for more days off.  The patient is not feeling 100% to return back to work. Is another virtual visit needed. Please advise [phone]

## 2018-06-26 NOTE — Telephone Encounter (Signed)
Pain improving but persists. Ok to write 1 more week out of work. Thanks .

## 2018-06-26 NOTE — Telephone Encounter (Signed)
I spoke with pt and he needs note to be out of work from 06/26/18 - 07/01/18. Pt had virtual visit on 06/20/18. Pt said the back pain is better but he cannot go to work and do his regular job. Pt does not think he needs a virtual visit at this time. Pt request cb when note is ready.

## 2018-06-26 NOTE — Telephone Encounter (Signed)
Wrote and printed work note. Notified pt note is ready for pick up.  Placed letter at front office in yellow folders.

## 2018-07-02 DIAGNOSIS — M62838 Other muscle spasm: Secondary | ICD-10-CM | POA: Diagnosis not present

## 2018-07-03 ENCOUNTER — Other Ambulatory Visit: Payer: Self-pay

## 2018-07-03 ENCOUNTER — Ambulatory Visit: Payer: Federal, State, Local not specified - PPO | Admitting: Family Medicine

## 2018-07-03 ENCOUNTER — Encounter: Payer: Self-pay | Admitting: Family Medicine

## 2018-07-03 ENCOUNTER — Ambulatory Visit (INDEPENDENT_AMBULATORY_CARE_PROVIDER_SITE_OTHER)
Admission: RE | Admit: 2018-07-03 | Discharge: 2018-07-03 | Disposition: A | Discharge status: Home / Self Care | Payer: Federal, State, Local not specified - PPO | Source: Ambulatory Visit | Attending: Family Medicine | Admitting: Family Medicine

## 2018-07-03 VITALS — BP 140/92 | HR 86 | Temp 98.2°F | Ht 74.0 in | Wt 263.5 lb

## 2018-07-03 DIAGNOSIS — M545 Low back pain, unspecified: Secondary | ICD-10-CM

## 2018-07-03 DIAGNOSIS — I10 Essential (primary) hypertension: Secondary | ICD-10-CM | POA: Diagnosis not present

## 2018-07-03 MED ORDER — AMLODIPINE BESYLATE 10 MG PO TABS: 10.0000 mg | ORAL_TABLET | Freq: Every day | ORAL | 1 refills | Status: DC

## 2018-07-03 MED ORDER — HYDROCHLOROTHIAZIDE 25 MG PO TABS: 25.0000 mg | ORAL_TABLET | Freq: Every day | ORAL | 1 refills | Status: DC

## 2018-07-03 NOTE — Assessment & Plan Note (Signed)
Ongoing pain and stiffness despite treatment to date. He feels robaxin may have been most helpful. Given diminished pulses and midline spine tenderness, will check lumbar films. Will refer to PT and write light duty restrictions at work for another 1 week. Pt agrees with plan.

## 2018-07-03 NOTE — Assessment & Plan Note (Signed)
BP remaining elevated - will increase hctz to 25mg  daily, continue amlodipine 10mg  daily. RTC 1 mo f/u visit.

## 2018-07-03 NOTE — Patient Instructions (Addendum)
Xray of spine today.  We will refer you to physical therapy - see our referral coordinator to set this up.  Continue methocarbamol.  Return in 1 month for follow up on blood pressure and lower back.  Letter for work provided today.  Start higher dose hctz 25mg  daily in addition to amlodipine.
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
## 2018-07-04 DIAGNOSIS — M545 Low back pain: Secondary | ICD-10-CM | POA: Diagnosis not present

## 2018-07-06 DIAGNOSIS — M545 Low back pain: Secondary | ICD-10-CM | POA: Diagnosis not present

## 2018-08-21 ENCOUNTER — Telehealth: Payer: Self-pay

## 2018-08-21 NOTE — Telephone Encounter (Signed)
Left detailed VM w COVID screen and back door lab info   

## 2018-08-22 ENCOUNTER — Other Ambulatory Visit: Payer: Self-pay | Admitting: Family Medicine

## 2018-08-22 DIAGNOSIS — E785 Hyperlipidemia, unspecified: Secondary | ICD-10-CM

## 2018-08-22 DIAGNOSIS — Z125 Encounter for screening for malignant neoplasm of prostate: Secondary | ICD-10-CM

## 2018-08-22 DIAGNOSIS — D693 Immune thrombocytopenic purpura: Secondary | ICD-10-CM

## 2018-08-22 DIAGNOSIS — I10 Essential (primary) hypertension: Secondary | ICD-10-CM

## 2018-08-23 ENCOUNTER — Other Ambulatory Visit (INDEPENDENT_AMBULATORY_CARE_PROVIDER_SITE_OTHER): Payer: Federal, State, Local not specified - PPO

## 2018-08-23 ENCOUNTER — Other Ambulatory Visit: Payer: Self-pay

## 2018-08-23 DIAGNOSIS — D693 Immune thrombocytopenic purpura: Secondary | ICD-10-CM | POA: Diagnosis not present

## 2018-08-23 DIAGNOSIS — I10 Essential (primary) hypertension: Secondary | ICD-10-CM

## 2018-08-23 DIAGNOSIS — Z125 Encounter for screening for malignant neoplasm of prostate: Secondary | ICD-10-CM | POA: Diagnosis not present

## 2018-08-23 DIAGNOSIS — E785 Hyperlipidemia, unspecified: Secondary | ICD-10-CM

## 2018-08-23 LAB — CBC WITH DIFFERENTIAL/PLATELET
Basophils Absolute: 0 10*3/uL (ref 0.0–0.1)
Basophils Relative: 0.7 % (ref 0.0–3.0)
Eosinophils Absolute: 0.2 10*3/uL (ref 0.0–0.7)
Eosinophils Relative: 3.9 % (ref 0.0–5.0)
HCT: 43.8 % (ref 39.0–52.0)
Hemoglobin: 15 g/dL (ref 13.0–17.0)
Lymphocytes Relative: 32.7 % (ref 12.0–46.0)
Lymphs Abs: 1.8 10*3/uL (ref 0.7–4.0)
MCHC: 34.2 g/dL (ref 30.0–36.0)
MCV: 83.7 fl (ref 78.0–100.0)
Monocytes Absolute: 0.7 10*3/uL (ref 0.1–1.0)
Monocytes Relative: 11.7 % (ref 3.0–12.0)
Neutro Abs: 2.9 10*3/uL (ref 1.4–7.7)
Neutrophils Relative %: 51 % (ref 43.0–77.0)
Platelets: 128 10*3/uL — ABNORMAL LOW (ref 150.0–400.0)
RBC: 5.23 Mil/uL (ref 4.22–5.81)
RDW: 15.1 % (ref 11.5–15.5)
WBC: 5.6 10*3/uL (ref 4.0–10.5)

## 2018-08-23 LAB — LIPID PANEL
Cholesterol: 199 mg/dL (ref 0–200)
HDL: 46 mg/dL (ref >39.00)
LDL Cholesterol: 123 mg/dL — ABNORMAL HIGH (ref 0–99)
NonHDL: 153.47
Total CHOL/HDL Ratio: 4
Triglycerides: 153 mg/dL — ABNORMAL HIGH (ref 0.0–149.0)
VLDL: 30.6 mg/dL (ref 0.0–40.0)

## 2018-08-23 LAB — COMPREHENSIVE METABOLIC PANEL
ALT: 42 U/L (ref 0–53)
AST: 23 U/L (ref 0–37)
Albumin: 4.4 g/dL (ref 3.5–5.2)
Alkaline Phosphatase: 59 U/L (ref 39–117)
BUN: 22 mg/dL (ref 6–23)
CO2: 28 mEq/L (ref 19–32)
Calcium: 9 mg/dL (ref 8.4–10.5)
Chloride: 96 mEq/L (ref 96–112)
Creatinine, Ser: 1.18 mg/dL (ref 0.40–1.50)
GFR: 79.75 mL/min (ref >60.00)
Glucose, Bld: 96 mg/dL (ref 70–99)
Potassium: 4.2 mEq/L (ref 3.5–5.1)
Sodium: 135 mEq/L (ref 135–145)
Total Bilirubin: 0.8 mg/dL (ref 0.2–1.2)
Total Protein: 7 g/dL (ref 6.0–8.3)

## 2018-08-23 LAB — PSA: PSA: 0.8 ng/mL (ref 0.10–4.00)

## 2018-08-23 LAB — MICROALBUMIN / CREATININE URINE RATIO
Creatinine,U: 248.1 mg/dL
Microalb Creat Ratio: 2.7 mg/g (ref 0.0–30.0)
Microalb, Ur: 6.6 mg/dL — ABNORMAL HIGH (ref 0.0–1.9)

## 2018-08-29 ENCOUNTER — Encounter: Payer: Federal, State, Local not specified - PPO | Admitting: Family Medicine

## 2018-11-24 ENCOUNTER — Other Ambulatory Visit: Payer: Self-pay

## 2018-11-24 DIAGNOSIS — Z20828 Contact with and (suspected) exposure to other viral communicable diseases: Secondary | ICD-10-CM | POA: Diagnosis not present

## 2018-11-24 DIAGNOSIS — Z20822 Contact with and (suspected) exposure to covid-19: Secondary | ICD-10-CM

## 2018-11-26 LAB — NOVEL CORONAVIRUS, NAA: SARS-CoV-2, NAA: NOT DETECTED

## 2018-11-29 ENCOUNTER — Encounter: Payer: Self-pay | Admitting: Family Medicine

## 2018-11-29 ENCOUNTER — Ambulatory Visit (INDEPENDENT_AMBULATORY_CARE_PROVIDER_SITE_OTHER): Payer: Federal, State, Local not specified - PPO | Admitting: Family Medicine

## 2018-11-29 VITALS — BP 137/90 | Temp 98.4°F | Ht 74.0 in | Wt 250.0 lb

## 2018-11-29 DIAGNOSIS — I10 Essential (primary) hypertension: Secondary | ICD-10-CM

## 2018-11-29 DIAGNOSIS — Z0000 Encounter for general adult medical examination without abnormal findings: Secondary | ICD-10-CM | POA: Diagnosis not present

## 2018-11-29 DIAGNOSIS — E785 Hyperlipidemia, unspecified: Secondary | ICD-10-CM

## 2018-11-29 DIAGNOSIS — D693 Immune thrombocytopenic purpura: Secondary | ICD-10-CM

## 2018-11-29 DIAGNOSIS — E669 Obesity, unspecified: Secondary | ICD-10-CM

## 2018-11-29 MED ORDER — AMLODIPINE BESYLATE 10 MG PO TABS: 10.0000 mg | ORAL_TABLET | Freq: Every day | ORAL | 3 refills | Status: DC

## 2018-11-29 MED ORDER — HYDROCHLOROTHIAZIDE 25 MG PO TABS: 25.0000 mg | ORAL_TABLET | Freq: Every day | ORAL | 3 refills | Status: DC

## 2018-11-29 NOTE — Assessment & Plan Note (Signed)
Chronic, stable off meds. Continue to monitor. The 10-year ASCVD risk score Mikey Bussing DC Brooke Bonito., et al., 2013) is: 9.2%   Values used to calculate the score:     Age: 48 years     Sex: Male     Is Non-Hispanic African American: Yes     Diabetic: No     Tobacco smoker: No     Systolic Blood Pressure: 493 mmHg     Is BP treated: Yes     HDL Cholesterol: 46 mg/dL     Total Cholesterol: 199 mg/dL

## 2018-11-29 NOTE — Assessment & Plan Note (Signed)
Chronic, stable. Continue current regimen. 

## 2018-11-29 NOTE — Assessment & Plan Note (Signed)
Congratulated on weight loss noted. Continue to encourage healthy diet and lifestyle choices to affect sustainable weight loss.

## 2018-11-29 NOTE — Assessment & Plan Note (Signed)
Preventative protocols reviewed and updated unless pt declined. Discussed healthy diet and lifestyle.  

## 2018-11-29 NOTE — Progress Notes (Signed)
Virtual visit completed through [employer]. Due to national recommendations of social distancing due to COVID-19, a virtual visit is felt to be most appropriate for this patient at this time. Reviewed limitations of a virtual visit.   Patient location: home Provider location: [facility], office If any vitals were documented, they were collected by patient at home unless specified below.    BP 137/90   Temp 98.4 F (36.9 C)   Ht 6\' 2"  (1.88 m)   Wt 250 lb (113.4 kg)   BMI 32.10 kg/m    CC: CPE Subjective:    Patient ID: , male    DOB: 1970/03/15, 48 y.o.   MRN: [id number]  HPI: Mike Knight is a 48 y.o. male presenting on 11/29/2018 for Annual Exam   HTN - does check bp at home, running well controlled.  Weight loss noted - walking more, watching portion sizes.  Son covid + but recovering well. Pt and wife were both tested and returned negative. Son has been able to self-isolate at home.   Preventative: Prostate cancer screening - father dx at age 83 yo of prostate cancer. checking yearly.  Flu - declines Tdap - 09/2012 Seat belt use discussed  Sunscreen use discussed. No changing moles on skin.  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 Interim medical history since our last visit reviewed. Allergies and medications reviewed and updated. Outpatient Medications Prior to Visit  Medication Sig Dispense Refill  . methocarbamol (ROBAXIN) 500 MG tablet Take 1 tablet (500 mg total) by mouth 3 (three) times daily as needed for muscle spasms.    02-20-1982 amLODipine (NORVASC) 10 MG tablet Take 1 tablet (10 mg total) by mouth daily. 90 tablet 1  .  hydrochlorothiazide (HYDRODIURIL) 25 MG tablet Take 1 tablet (25 mg total) by mouth daily. 90 tablet 1  . meloxicam (MOBIC) 15 MG tablet Take 1 tablet by mouth daily. Take for 7 (seven) days     No facility-administered medications prior to visit.      Per HPI unless specifically indicated in ROS section below Review of Systems  Constitutional: Negative for activity change, appetite change, chills, fatigue, fever and unexpected weight change.  HENT: Negative for hearing loss.   Eyes: Negative for visual disturbance.  Respiratory: Negative for cough, chest tightness, shortness of breath and wheezing.   Cardiovascular: Negative for chest pain, palpitations and leg swelling.  Gastrointestinal: Negative for abdominal distention, abdominal pain, blood in stool, constipation, diarrhea, nausea and vomiting.  Genitourinary: Negative for difficulty urinating and hematuria.  Musculoskeletal: Negative for arthralgias, myalgias and neck pain.  Skin: Negative for rash.  Neurological: Negative for dizziness, seizures, syncope and headaches.  Hematological: Negative for adenopathy. Does not bruise/bleed easily.  Psychiatric/Behavioral: Negative for dysphoric mood. The patient is not nervous/anxious.    Objective:    BP 137/90   Temp 98.4 F (36.9 C)   Ht 6\' 2"  (1.88 m)   Wt 250 lb (113.4 kg)   BMI 32.10 kg/m   Wt Readings from Last 3 Encounters:  11/29/18 250 lb (113.4 kg)  07/03/18 263 lb 8 oz (119.5 kg)  06/20/18 256 lb (116.1 kg)     Physical exam: Gen: alert, NAD,  not ill appearing Pulm: speaks in complete sentences without increased work of breathing Psych: normal mood, normal thought content      Results for orders placed or performed in visit on 11/24/18  Novel Coronavirus, NAA ([facility])   Specimen: Nasopharyngeal(NP) swabs in vial transport medium   NASOPHARYNGE  TESTING  Result Value Ref Range   SARS-CoV-2, NAA Not Detected Not Detected   Assessment & Plan:   Problem List  Items Addressed This Visit    Routine health maintenance - Primary    Preventative protocols reviewed and updated unless pt declined. Discussed healthy diet and lifestyle.       Obesity, Class I, BMI 30-34.9    Congratulated on weight loss noted. Continue to encourage healthy diet and lifestyle choices to affect sustainable weight loss.       Hypertension, essential    Chronic, stable. Continue current regimen.       Relevant Medications   amLODipine (NORVASC) 10 MG tablet   hydrochlorothiazide (HYDRODIURIL) 25 MG tablet   HLD (hyperlipidemia)    Chronic, stable off meds. Continue to monitor. The 10-year ASCVD risk score Mike Bussing DC Brooke Bonito., et al., 2013) is: 9.2%   Values used to calculate the score:     Age: 46 years     Sex: Male     Is Non-Hispanic African American: Yes     Diabetic: No     Tobacco smoker: No     Systolic Blood Pressure: 696 mmHg     Is BP treated: Yes     HDL Cholesterol: 46 mg/dL     Total Cholesterol: 199 mg/dL       Relevant Medications   amLODipine (NORVASC) 10 MG tablet   hydrochlorothiazide (HYDRODIURIL) 25 MG tablet   Chronic idiopathic thrombocytopenia (HCC)    Stable period - continue yearly check.           Meds ordered this encounter  Medications  . amLODipine (NORVASC) 10 MG tablet    Sig: Take 1 tablet (10 mg total) by mouth daily.    Dispense:  90 tablet    Refill:  3  . hydrochlorothiazide (HYDRODIURIL) 25 MG tablet    Sig: Take 1 tablet (25 mg total) by mouth daily.    Dispense:  90 tablet    Refill:  3   No orders of the defined types were placed in this encounter.   I discussed the assessment and treatment plan with the patient. The patient was provided an opportunity to ask questions and all were answered. The patient agreed with the plan and demonstrated an understanding of the instructions. The patient was advised to call back or seek an in-person evaluation if the symptoms worsen or if the condition fails to improve as  anticipated.  Follow up plan: No follow-ups on file.  Ria Bush, MD

## 2018-11-29 NOTE — Assessment & Plan Note (Signed)
Stable period - continue yearly check.

## 2019-06-15 DIAGNOSIS — M545 Low back pain: Secondary | ICD-10-CM | POA: Diagnosis not present

## 2019-08-27 ENCOUNTER — Other Ambulatory Visit: Payer: Self-pay | Admitting: Family Medicine

## 2019-09-28 ENCOUNTER — Encounter: Payer: Self-pay | Admitting: Family Medicine

## 2019-09-28 ENCOUNTER — Other Ambulatory Visit: Payer: Self-pay

## 2019-09-28 ENCOUNTER — Ambulatory Visit: Payer: Federal, State, Local not specified - PPO | Admitting: Family Medicine

## 2019-09-28 DIAGNOSIS — M545 Low back pain, unspecified: Secondary | ICD-10-CM

## 2019-09-28 MED ORDER — METHOCARBAMOL 500 MG PO TABS: 500.0000 mg | ORAL_TABLET | Freq: Three times a day (TID) | ORAL | 1 refills | Status: DC | PRN

## 2019-09-28 MED ORDER — DEXAMETHASONE SODIUM PHOSPHATE 10 MG/ML IJ SOLN
10.0000 mg | Freq: Once | INTRAMUSCULAR | Status: AC
  Administered 2019-09-28: 10 mg via INTRAMUSCULAR
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
## 2019-09-28 NOTE — Patient Instructions (Addendum)
I think you've pulled your back again (lumbar strain)  Dexamethasone 10mg  injection today for inflammation Continue robaxin 500mg  1-2 tablets up to three times daily, as well as ibuprofen up to 600mg  three times daily with meals.  Gentle stretching of lower back, rest, ice or heat.  Out of work through next Saturday. Let know if not improving with this.

## 2019-09-28 NOTE — Assessment & Plan Note (Signed)
Left lower back pain without radiation - anticipate recurrent lumbar strain in h/o same. Reviewed lumbar films from last year. Treat conservatively with lower back pain exercises, decadron 10mg  IM today, then robaxin and ibuprofen. Out of work x 1 week. Update if not improving with treatment. Pt agrees with plan.

## 2019-09-30 ENCOUNTER — Other Ambulatory Visit: Payer: Self-pay | Admitting: Family Medicine

## 2019-10-04 ENCOUNTER — Telehealth: Payer: Self-pay | Admitting: Family Medicine

## 2019-10-04 NOTE — Telephone Encounter (Signed)
Placed letter on Dr. Lianne Bushy desk to be signed.

## 2019-10-04 NOTE — Telephone Encounter (Signed)
Done. Thanks.

## 2019-10-04 NOTE — Telephone Encounter (Signed)
Noted.    Spoke with notifying him work note is ready to pick up.  Says he will get it tomorrow.  [Placed letter at front office- yellow folders.]

## 2019-10-04 NOTE — Telephone Encounter (Signed)
Pt said Dr. Reece Agar told him if he needs another week to be out of work to let him know. Pt said he needs Dr. Reece Agar to write that note for next week and call him when he can pick it up.

## 2019-10-04 NOTE — Telephone Encounter (Signed)
Please update the letter with the new dates and I'll sign it.  Thanks.

## 2019-10-04 NOTE — Telephone Encounter (Signed)
Spoke with pt notifying him Dr. Reece Agar is out of the office for the week.  Informed pt the message will be forwarded to covering provider.  Pt verbalizes understanding.  Dr. Para March, do you want me to print another work note with new dates for next week to let you sign?

## 2019-11-08 IMAGING — DX LUMBAR SPINE - COMPLETE 4+ VIEW
5 series · 5 of 5 positions shown · non-contrast
Comparison: None.

CLINICAL DATA: Lumbago for 3 weeks

EXAM:
LUMBAR SPINE - COMPLETE 4+ VIEW

[l-spine ap]
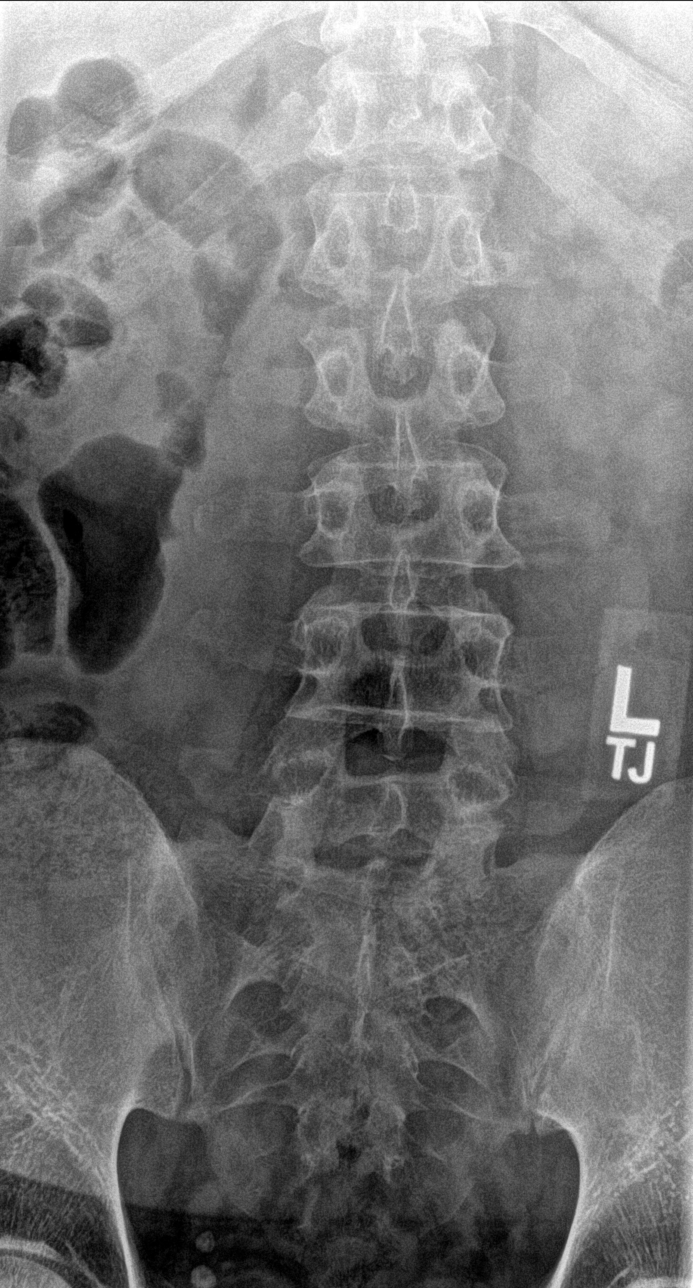

[l-spine obl (1 of 2)]
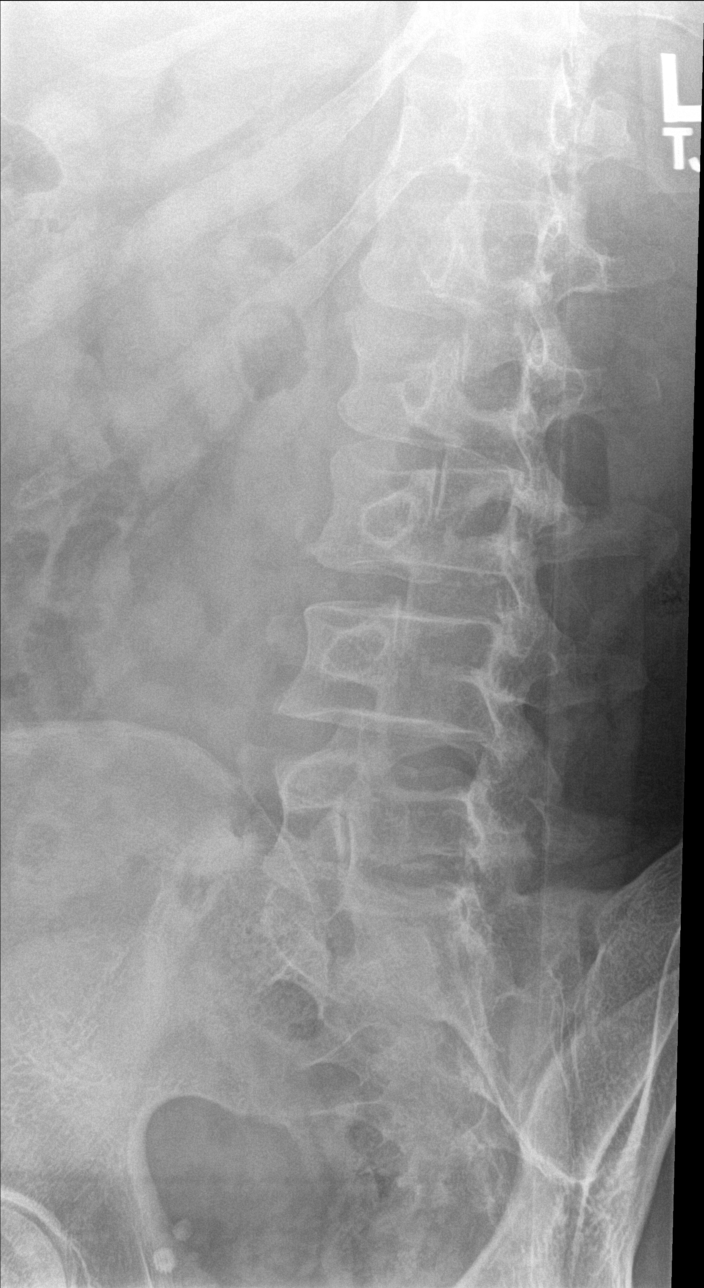

[l-spine obl (2 of 2)]
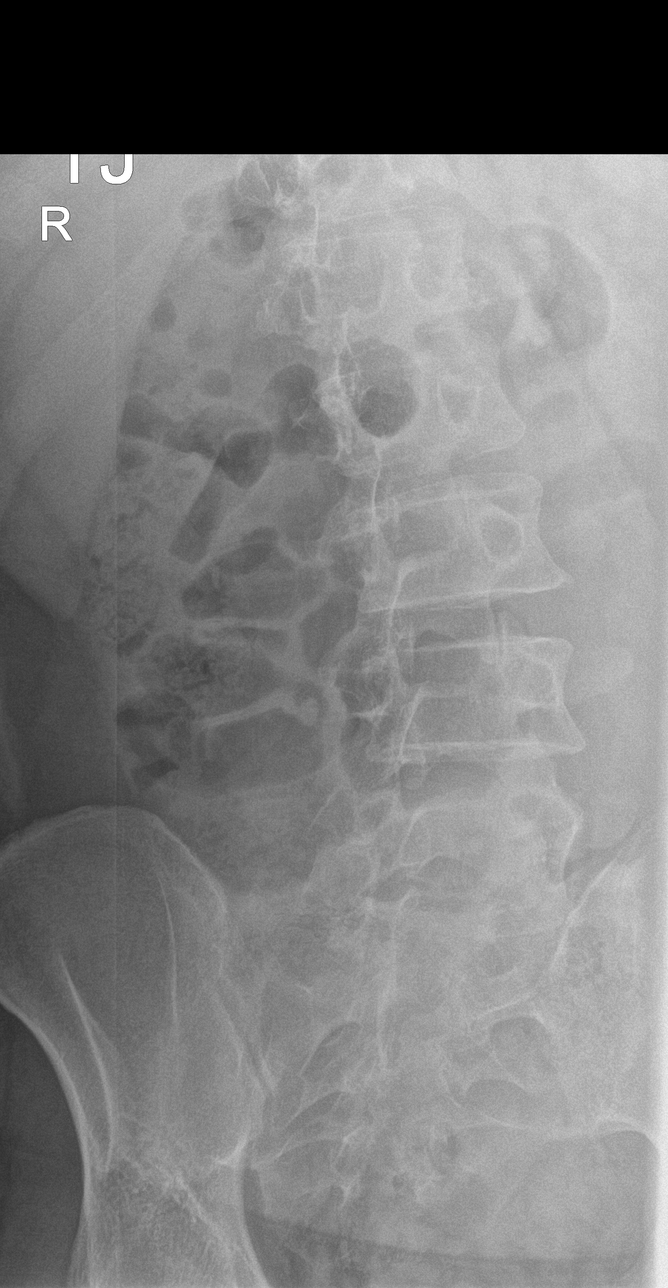

[l-spine lat]
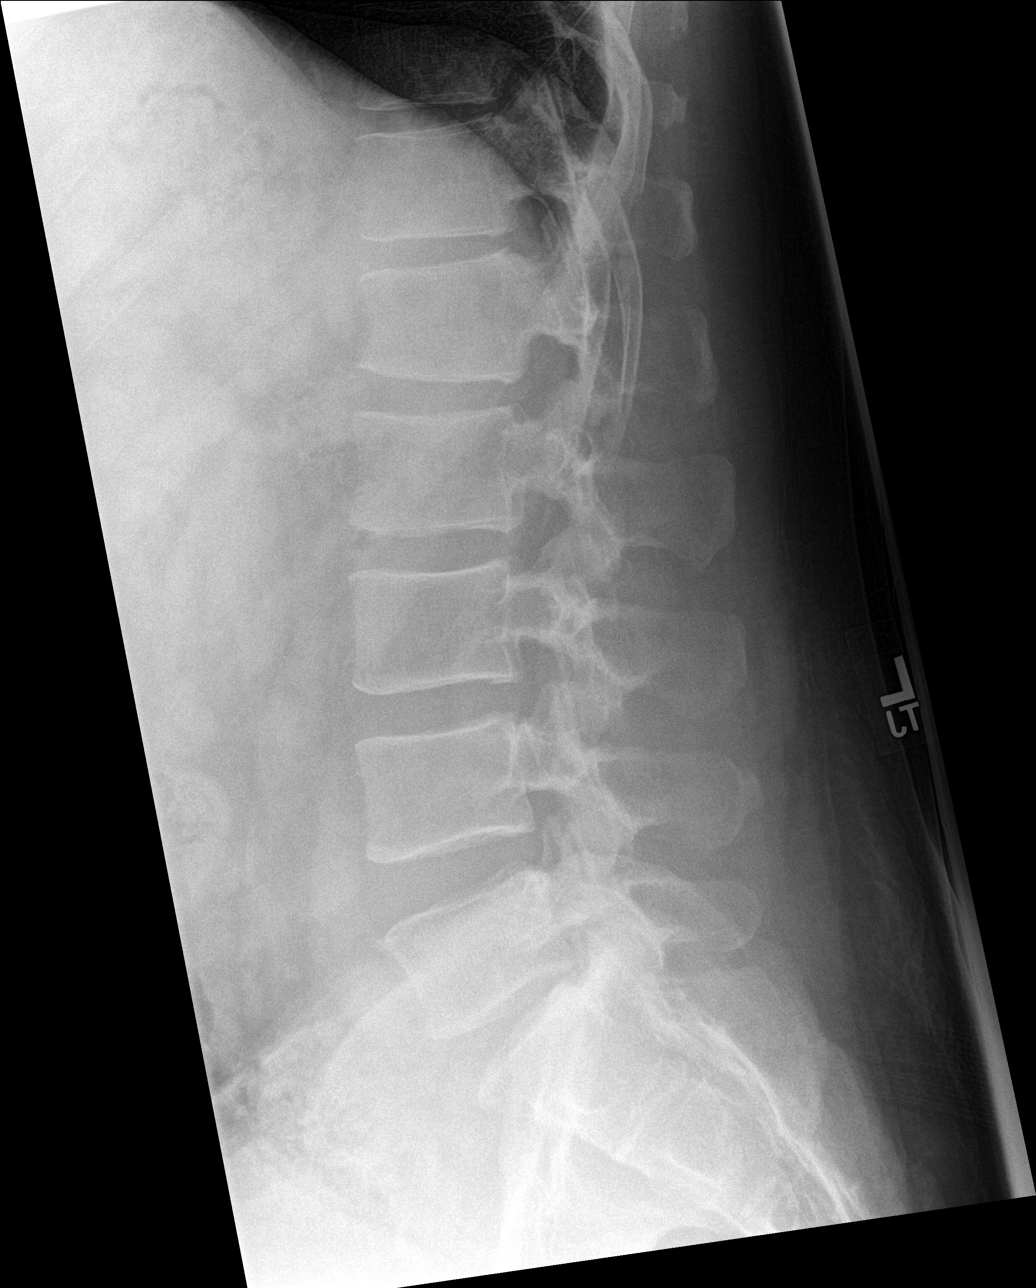

[l-spine l5/s1]
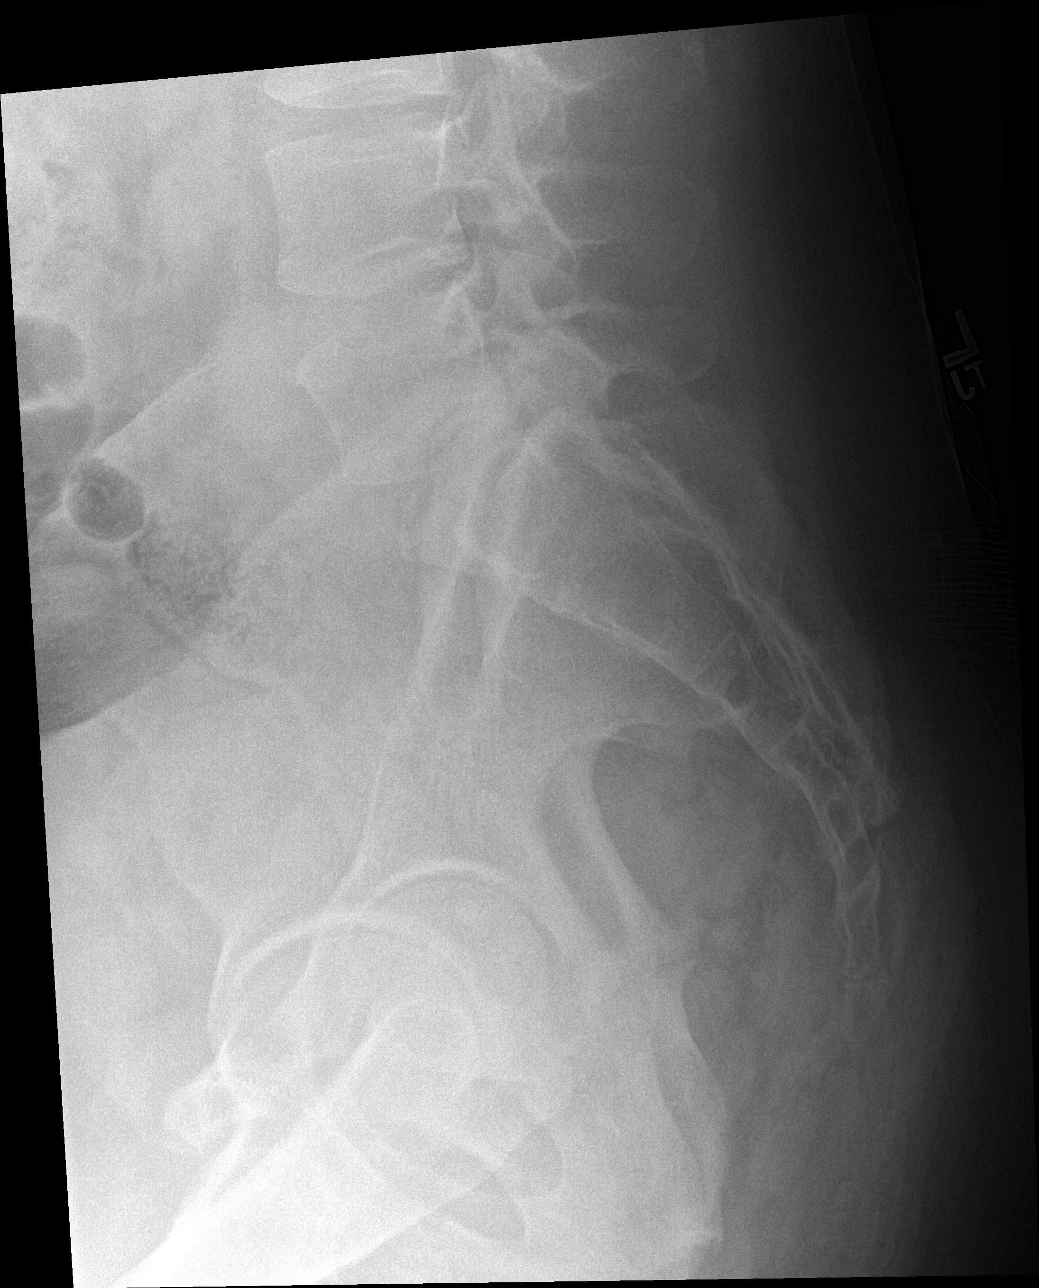

[5 of 5 positions shown; findings below may reference images not displayed]

FINDINGS: Frontal, lateral, spot lumbosacral lateral, and bilateral oblique
views were obtained. There are 5 non-rib-bearing lumbar type
vertebral bodies. There is mild lumbar levoscoliosis. There is no
fracture or spondylolisthesis. There is slight disc space narrowing
at L5-S1. Other disc spaces appear unremarkable. There is no
appreciable facet arthropathy.
IMPRESSION: Slight scoliosis. No fracture or spondylolisthesis. There is slight
disc space narrowing at L5-S1. Other disc spaces appear
unremarkable.

## 2019-11-22 ENCOUNTER — Other Ambulatory Visit: Payer: Self-pay | Admitting: Family Medicine

## 2019-11-22 DIAGNOSIS — D693 Immune thrombocytopenic purpura: Secondary | ICD-10-CM

## 2019-11-22 DIAGNOSIS — Z125 Encounter for screening for malignant neoplasm of prostate: Secondary | ICD-10-CM

## 2019-11-22 DIAGNOSIS — I10 Essential (primary) hypertension: Secondary | ICD-10-CM

## 2019-11-22 DIAGNOSIS — E785 Hyperlipidemia, unspecified: Secondary | ICD-10-CM

## 2019-11-26 ENCOUNTER — Other Ambulatory Visit: Payer: Federal, State, Local not specified - PPO

## 2019-12-03 ENCOUNTER — Encounter: Payer: Federal, State, Local not specified - PPO | Admitting: Family Medicine

## 2020-01-23 ENCOUNTER — Telehealth: Payer: Self-pay | Admitting: Family Medicine

## 2020-01-24 NOTE — Telephone Encounter (Signed)
E-scribed.  Plz schedule cpe and lab visits.

## 2020-02-06 ENCOUNTER — Encounter: Payer: Self-pay | Admitting: Family Medicine

## 2020-02-06 NOTE — Telephone Encounter (Signed)
Patient called twice. NO vm to leave info will mail a letter EM 02/06/20

## 2020-02-06 NOTE — Telephone Encounter (Signed)
Noted  

## 2020-02-22 ENCOUNTER — Other Ambulatory Visit: Payer: Self-pay | Admitting: Family Medicine

## 2020-04-20 ENCOUNTER — Other Ambulatory Visit: Payer: Self-pay | Admitting: Family Medicine

## 2020-04-21 NOTE — Telephone Encounter (Signed)
Refill request HCTZ Last office visit 09/28/19 acute visit Patient is due a CPE, letter mailed to patient 02/06/20 No upcoming appointment scheduled Last refill 01/24/20 #90

## 2020-05-23 ENCOUNTER — Other Ambulatory Visit: Payer: Self-pay | Admitting: Family Medicine

## 2020-07-27 ENCOUNTER — Other Ambulatory Visit: Payer: Self-pay | Admitting: Family Medicine

## 2020-10-08 ENCOUNTER — Encounter: Payer: Self-pay | Admitting: Family Medicine

## 2020-10-08 ENCOUNTER — Other Ambulatory Visit: Payer: Self-pay

## 2020-10-08 ENCOUNTER — Ambulatory Visit (INDEPENDENT_AMBULATORY_CARE_PROVIDER_SITE_OTHER): Payer: Federal, State, Local not specified - PPO | Admitting: Family Medicine

## 2020-10-08 VITALS — BP 156/110 | HR 71 | Temp 97.4°F | Ht 74.0 in | Wt 246.0 lb

## 2020-10-08 DIAGNOSIS — Z1211 Encounter for screening for malignant neoplasm of colon: Secondary | ICD-10-CM | POA: Diagnosis not present

## 2020-10-08 DIAGNOSIS — Z87891 Personal history of nicotine dependence: Secondary | ICD-10-CM | POA: Diagnosis not present

## 2020-10-08 DIAGNOSIS — Z0000 Encounter for general adult medical examination without abnormal findings: Secondary | ICD-10-CM

## 2020-10-08 DIAGNOSIS — E785 Hyperlipidemia, unspecified: Secondary | ICD-10-CM | POA: Diagnosis not present

## 2020-10-08 DIAGNOSIS — I10 Essential (primary) hypertension: Secondary | ICD-10-CM

## 2020-10-08 DIAGNOSIS — Z125 Encounter for screening for malignant neoplasm of prostate: Secondary | ICD-10-CM

## 2020-10-08 DIAGNOSIS — E669 Obesity, unspecified: Secondary | ICD-10-CM

## 2020-10-08 DIAGNOSIS — D693 Immune thrombocytopenic purpura: Secondary | ICD-10-CM | POA: Diagnosis not present

## 2020-10-08 LAB — PSA: PSA: 0.85 ng/mL (ref 0.10–4.00)

## 2020-10-08 LAB — COMPREHENSIVE METABOLIC PANEL
ALT: 23 U/L (ref 0–53)
AST: 20 U/L (ref 0–37)
Albumin: 4.4 g/dL (ref 3.5–5.2)
Alkaline Phosphatase: 51 U/L (ref 39–117)
BUN: 10 mg/dL (ref 6–23)
CO2: 30 mEq/L (ref 19–32)
Calcium: 9.5 mg/dL (ref 8.4–10.5)
Chloride: 98 mEq/L (ref 96–112)
Creatinine, Ser: 0.92 mg/dL (ref 0.40–1.50)
GFR: 97.3 mL/min (ref >60.00)
Glucose, Bld: 95 mg/dL (ref 70–99)
Potassium: 4.4 mEq/L (ref 3.5–5.1)
Sodium: 135 mEq/L (ref 135–145)
Total Bilirubin: 0.7 mg/dL (ref 0.2–1.2)
Total Protein: 7.2 g/dL (ref 6.0–8.3)

## 2020-10-08 LAB — CBC WITH DIFFERENTIAL/PLATELET
Basophils Absolute: 0 10*3/uL (ref 0.0–0.1)
Basophils Relative: 0.5 % (ref 0.0–3.0)
Eosinophils Absolute: 0.2 10*3/uL (ref 0.0–0.7)
Eosinophils Relative: 3.1 % (ref 0.0–5.0)
HCT: 49.4 % (ref 39.0–52.0)
Hemoglobin: 17.3 g/dL — ABNORMAL HIGH (ref 13.0–17.0)
Lymphocytes Relative: 26.2 % (ref 12.0–46.0)
Lymphs Abs: 1.3 10*3/uL (ref 0.7–4.0)
MCHC: 34.9 g/dL (ref 30.0–36.0)
MCV: 83.5 fl (ref 78.0–100.0)
Monocytes Absolute: 0.5 10*3/uL (ref 0.1–1.0)
Monocytes Relative: 9.7 % (ref 3.0–12.0)
Neutro Abs: 3.1 10*3/uL (ref 1.4–7.7)
Neutrophils Relative %: 60.5 % (ref 43.0–77.0)
Platelets: 111 10*3/uL — ABNORMAL LOW (ref 150.0–400.0)
RBC: 5.92 Mil/uL — ABNORMAL HIGH (ref 4.22–5.81)
RDW: 14.6 % (ref 11.5–15.5)
WBC: 5.1 10*3/uL (ref 4.0–10.5)

## 2020-10-08 LAB — LIPID PANEL
Cholesterol: 182 mg/dL (ref 0–200)
HDL: 55.9 mg/dL (ref >39.00)
LDL Cholesterol: 111 mg/dL — ABNORMAL HIGH (ref 0–99)
NonHDL: 126.54
Total CHOL/HDL Ratio: 3
Triglycerides: 80 mg/dL (ref 0.0–149.0)
VLDL: 16 mg/dL (ref 0.0–40.0)

## 2020-10-08 LAB — MICROALBUMIN / CREATININE URINE RATIO
Creatinine,U: 52.8 mg/dL
Microalb Creat Ratio: 1.3 mg/g (ref 0.0–30.0)
Microalb, Ur: <0.7 mg/dL (ref 0.0–1.9)

## 2020-10-08 MED ORDER — HYDROCHLOROTHIAZIDE 25 MG PO TABS: 25.0000 mg | ORAL_TABLET | Freq: Every day | ORAL | 3 refills | Status: DC

## 2020-10-08 MED ORDER — AMLODIPINE BESYLATE 10 MG PO TABS: 10.0000 mg | ORAL_TABLET | Freq: Every day | ORAL | 3 refills | Status: DC

## 2020-10-08 NOTE — Assessment & Plan Note (Signed)
Update labs off meds.

## 2020-10-08 NOTE — Assessment & Plan Note (Signed)
Update CBC. 

## 2020-10-08 NOTE — Patient Instructions (Addendum)
We will refer you for colonoscopy.  Labs today  Consider shingrix vaccine - 2 shot series to help prevent shingles.  Good to see you today Return as needed or in 1 year for next physical.   Health Maintenance, Male Adopting a healthy lifestyle and getting preventive care are important in promoting health and wellness. Ask your health care provider about: The right schedule for you to have regular tests and exams. Things you can do on your own to prevent diseases and keep yourself healthy. What should I know about diet, weight, and exercise? Eat a healthy diet  Eat a diet that includes plenty of vegetables, fruits, low-fat dairy products, and lean protein. Do not eat a lot of foods that are high in solid fats, added sugars, or sodium.  Maintain a healthy weight Body mass index (BMI) is a measurement that can be used to identify possible weight problems. It estimates body fat based on height and weight. Your health care provider can help determine your BMI and help you achieve or maintain ahealthy weight. Get regular exercise Get regular exercise. This is one of the most important things you can do for your health. Most adults should: Exercise for at least 150 minutes each week. The exercise should increase your heart rate and make you sweat (moderate-intensity exercise). Do strengthening exercises at least twice a week. This is in addition to the moderate-intensity exercise. Spend less time sitting. Even light physical activity can be beneficial. Watch cholesterol and blood lipids Have your blood tested for lipids and cholesterol at 50 years of age, then havethis test every 5 years. You may need to have your cholesterol levels checked more often if: Your lipid or cholesterol levels are high. You are older than 50 years of age. You are at high risk for heart disease. What should I know about cancer screening? Many types of cancers can be detected early and may often be prevented.  Depending on your health history and family history, you may need to have cancer screening at various ages. This may include screening for: Colorectal cancer. Prostate cancer. Skin cancer. Lung cancer. What should I know about heart disease, diabetes, and high blood pressure? Blood pressure and heart disease High blood pressure causes heart disease and increases the risk of stroke. This is more likely to develop in people who have high blood pressure readings, are of African descent, or are overweight. Talk with your health care provider about your target blood pressure readings. Have your blood pressure checked: Every 3-5 years if you are 78-41 years of age. Every year if you are 68 years old or older. If you are between the ages of 49 and 64 and are a current or former smoker, ask your health care provider if you should have a one-time screening for abdominal aortic aneurysm (AAA). Diabetes Have regular diabetes screenings. This checks your fasting blood sugar level. Have the screening done: Once every three years after age 58 if you are at a normal weight and have a low risk for diabetes. More often and at a younger age if you are overweight or have a high risk for diabetes. What should I know about preventing infection? Hepatitis B If you have a higher risk for hepatitis B, you should be screened for this virus. Talk with your health care provider to find out if you are at risk forhepatitis B infection. Hepatitis C Blood testing is recommended for: Everyone born from [birth year] through 1965. Anyone with known risk factors for  hepatitis C. Sexually transmitted infections (STIs) You should be screened each year for STIs, including gonorrhea and chlamydia, if: You are sexually active and are younger than 50 years of age. You are older than 50 years of age and your health care provider tells you that you are at risk for this type of infection. Your sexual activity has changed since you were  last screened, and you are at increased risk for chlamydia or gonorrhea. Ask your health care provider if you are at risk. Ask your health care provider about whether you are at high risk for HIV. Your health care provider may recommend a prescription medicine to help prevent HIV infection. If you choose to take medicine to prevent HIV, you should first get tested for HIV. You should then be tested every 3 months for as long as you are taking the medicine. Follow these instructions at home: Lifestyle Do not use any products that contain nicotine or tobacco, such as cigarettes, e-cigarettes, and chewing tobacco. If you need help quitting, ask your health care provider. Do not use street drugs. Do not share needles. Ask your health care provider for help if you need support or information about quitting drugs. Alcohol use Do not drink alcohol if your health care provider tells you not to drink. If you drink alcohol: Limit how much you have to 0-2 drinks a day. Be aware of how much alcohol is in your drink. In the [place] one drink equals one 12 oz bottle of beer (355 mL), one 5 oz glass of wine (148 mL), or one 1 oz glass of hard liquor (44 mL). General instructions Schedule regular health, dental, and eye exams. Stay current with your vaccines. Tell your health care provider if: You often feel depressed. You have ever been abused or do not feel safe at home. Summary Adopting a healthy lifestyle and getting preventive care are important in promoting health and wellness. Follow your health care provider's instructions about healthy diet, exercising, and getting tested or screened for diseases. Follow your health care provider's instructions on monitoring your cholesterol and blood pressure. This information is not intended to replace advice given to you by your health care provider. Make sure you discuss any questions you have with your healthcare provider. Document Revised: 01/25/2018 Document  Reviewed: 01/25/2018 Elsevier Patient Education  2022 [employer].

## 2020-10-08 NOTE — Assessment & Plan Note (Signed)
Preventative protocols reviewed and updated unless pt declined. Discussed healthy diet and lifestyle.  

## 2020-10-08 NOTE — Assessment & Plan Note (Signed)
Remains abstinent 

## 2020-10-08 NOTE — Assessment & Plan Note (Signed)
Chronic, deteriorated. He has been off amlodipine. Will restart this along with HCTZ. RTC 2 months for HTN f/u visit.

## 2020-10-08 NOTE — Assessment & Plan Note (Signed)
Encouraged ongoing healthy diet and lifestyle choices to affect sustainable weight loss.  

## 2020-10-08 NOTE — Progress Notes (Signed)
Patient ID: Mike Knight, male    DOB: 1970/05/12, 50 y.o.   MRN: [id number]  This visit was conducted in person.  BP (!) 156/110 (BP Location: Right Arm, Cuff Size: Large)   Pulse 71   Temp (!) 97.4 F (36.3 C) (Temporal)   Ht 6\' 2"  (1.88 m)   Wt 246 lb (111.6 kg)   SpO2 98%   BMI 31.58 kg/m    CC: CPE Subjective:   HPI: Mike Knight is a 50 y.o. male presenting on 10/08/2020 for Annual Exam   Working on weight loss - more fish/chicken/vegetables, less beef. Trying to exercise more regularly (stationary bicycle in am).   HTN - has not been checking BP at home. Continues hctz - may have run out of amlodipine.   Preventative: Colon cancer screening - discussed options, would like colonoscopy  Prostate cancer screening - father dx at age 37 yo of prostate cancer. Checking yearly. DRE/PSA today.  Lung cancer screening - not eligible <20PY hx Flu - declines COVID vaccine 05/2019 x2  Tdap - 09/2012 Shingrix - discussed Seat belt use discussed  Sunscreen use discussed. No changing moles on skin.  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 Interim medical history since our last visit reviewed. Allergies and medications reviewed and updated. Outpatient Medications Prior to Visit  Medication Sig Dispense Refill   methocarbamol (ROBAXIN) 500 MG tablet Take 1-2 tablets (500-1,000 mg total) by mouth 3 (three) times daily as needed for muscle spasms (sedation precautions). 40 tablet 1   amLODipine (NORVASC) 10 MG tablet TAKE 1 TABLET(10 MG) BY MOUTH DAILY 90 tablet 0   hydrochlorothiazide (HYDRODIURIL) 25 MG tablet TAKE 1 TABLET(25 MG) BY MOUTH DAILY 90 tablet 0   No  facility-administered medications prior to visit.     Per HPI unless specifically indicated in ROS section below Review of Systems  Constitutional:  Negative for activity change, appetite change, chills, fatigue, fever and unexpected weight change.  HENT:  Negative for hearing loss.   Eyes:  Negative for visual disturbance.  Respiratory:  Negative for cough, chest tightness, shortness of breath and wheezing.   Cardiovascular:  Negative for chest pain, palpitations and leg swelling.  Gastrointestinal:  Negative for abdominal distention, abdominal pain, blood in stool, constipation, diarrhea, nausea and vomiting.  Genitourinary:  Negative for difficulty urinating and hematuria.  Musculoskeletal:  Negative for arthralgias, myalgias and neck pain.  Skin:  Negative for rash.  Neurological:  Negative for dizziness, seizures, syncope and headaches.  Hematological:  Negative for adenopathy. Does not bruise/bleed easily.  Psychiatric/Behavioral:  Negative for dysphoric mood. The patient is not nervous/anxious.    Objective:  BP (!) 156/110 (BP Location: Right Arm, Cuff Size: Large)   Pulse 71   Temp (!) 97.4 F (36.3 C) (Temporal)   Ht 6\' 2"  (1.88 m)   Wt 246 lb (111.6 kg)   SpO2 98%   BMI 31.58 kg/m   Wt Readings from Last 3 Encounters:  10/08/20 246 lb (111.6 kg)  09/28/19 252 lb (114.3 kg)  11/29/18 250 lb (113.4 kg)      Physical Exam Vitals and nursing note reviewed.  Constitutional:      General: He is not in  acute distress.    Appearance: Normal appearance. He is well-developed. He is not ill-appearing.  HENT:     Head: Normocephalic and atraumatic.     Right Ear: Hearing, tympanic membrane, ear canal and external ear normal.     Left Ear: Hearing, tympanic membrane, ear canal and external ear normal.  Eyes:     General: No scleral icterus.    Extraocular Movements: Extraocular movements intact.     Conjunctiva/sclera: Conjunctivae normal.     Pupils: Pupils are equal,  round, and reactive to light.  Neck:     Thyroid: No thyroid mass or thyromegaly.  Cardiovascular:     Rate and Rhythm: Normal rate and regular rhythm.     Pulses: Normal pulses.          Radial pulses are 2+ on the right side and 2+ on the left side.     Heart sounds: Normal heart sounds. No murmur heard. Pulmonary:     Effort: Pulmonary effort is normal. No respiratory distress.     Breath sounds: Normal breath sounds. No wheezing, rhonchi or rales.  Abdominal:     General: Bowel sounds are normal. There is no distension.     Palpations: Abdomen is soft. There is no mass.     Tenderness: There is no abdominal tenderness. There is no guarding or rebound.     Hernia: No hernia is present.  Genitourinary:    Prostate: Enlarged (30gm). Not tender and no nodules present.     Rectum: Normal. No mass, tenderness, anal fissure, external hemorrhoid or internal hemorrhoid. Normal anal tone.  Musculoskeletal:        General: Normal range of motion.     Cervical back: Normal range of motion and neck supple.     Right lower leg: No edema.     Left lower leg: No edema.  Lymphadenopathy:     Cervical: No cervical adenopathy.  Skin:    General: Skin is warm and dry.     Findings: No rash.  Neurological:     General: No focal deficit present.     Mental Status: He is alert and oriented to person, place, and time.  Psychiatric:        Mood and Affect: Mood normal.        Behavior: Behavior normal.        Thought Content: Thought content normal.        Judgment: Judgment normal.      Results for orders placed or performed in visit on 11/24/18  Novel Coronavirus, NAA ([facility])   Specimen: Nasopharyngeal(NP) swabs in vial transport medium   NASOPHARYNGE  TESTING  Result Value Ref Range   SARS-CoV-2, NAA Not Detected Not Detected    Assessment & Plan:  This visit occurred during the SARS-CoV-2 public health emergency.  Safety protocols were in place, including screening questions prior to  the visit, additional usage of staff PPE, and extensive cleaning of exam room while observing appropriate contact time as indicated for disinfecting solutions.   Problem List Items Addressed This Visit     Chronic idiopathic thrombocytopenia (HCC)    Update CBC.       Ex-smoker    Remains abstinent      Routine health maintenance - Primary    Preventative protocols reviewed and updated unless pt declined. Discussed healthy diet and lifestyle.       Hypertension, essential    Chronic, deteriorated. He has been off amlodipine. Will restart this along with  HCTZ. RTC 2 months for HTN f/u visit.       Relevant Medications   hydrochlorothiazide (HYDRODIURIL) 25 MG tablet   amLODipine (NORVASC) 10 MG tablet   Obesity, Class I, BMI 30-34.9    Encouraged ongoing healthy diet and lifestyle choices to affect sustainable weight loss.       HLD (hyperlipidemia)    Update labs off meds.       Relevant Medications   hydrochlorothiazide (HYDRODIURIL) 25 MG tablet   amLODipine (NORVASC) 10 MG tablet   Other Visit Diagnoses     Special screening for malignant neoplasms, colon       Relevant Orders   Ambulatory referral to Gastroenterology   Special screening for malignant neoplasm of prostate            Meds ordered this encounter  Medications   hydrochlorothiazide (HYDRODIURIL) 25 MG tablet    Sig: Take 1 tablet (25 mg total) by mouth daily.    Dispense:  90 tablet    Refill:  3   amLODipine (NORVASC) 10 MG tablet    Sig: Take 1 tablet (10 mg total) by mouth daily.    Dispense:  90 tablet    Refill:  3   Orders Placed This Encounter  Procedures   Ambulatory referral to Gastroenterology    Referral Priority:   Routine    Referral Type:   Consultation    Referral Reason:   Specialty Services Required    Number of Visits Requested:   1    Patient instructions: We will refer you for colonoscopy.  Labs today  Consider shingrix vaccine - 2 shot series to help prevent  shingles.  Good to see you today Return as needed or in 1 year for next physical.   Follow up plan: Return in about 1 year (around 10/08/2021) for annual exam, prior fasting for blood work.  Eustaquio Boyden, MD

## 2021-02-19 ENCOUNTER — Encounter: Payer: Self-pay | Admitting: Family Medicine

## 2021-03-07 DIAGNOSIS — B029 Zoster without complications: Secondary | ICD-10-CM | POA: Diagnosis not present

## 2021-03-16 DIAGNOSIS — B029 Zoster without complications: Secondary | ICD-10-CM | POA: Diagnosis not present

## 2021-04-17 ENCOUNTER — Other Ambulatory Visit: Payer: Self-pay

## 2021-04-17 ENCOUNTER — Encounter: Payer: Self-pay | Admitting: Nurse Practitioner

## 2021-04-17 ENCOUNTER — Ambulatory Visit: Payer: Federal, State, Local not specified - PPO | Admitting: Nurse Practitioner

## 2021-04-17 VITALS — BP 130/94 | HR 80 | Temp 97.7°F | Ht 74.0 in | Wt 251.0 lb

## 2021-04-17 DIAGNOSIS — B0229 Other postherpetic nervous system involvement: Secondary | ICD-10-CM

## 2021-04-17 MED ORDER — LIDOCAINE 5 % EX PTCH: 1.0000 | MEDICATED_PATCH | CUTANEOUS | 0 refills | Status: DC

## 2021-04-17 MED ORDER — GABAPENTIN 100 MG PO CAPS: 100.0000 mg | ORAL_CAPSULE | Freq: Every day | ORAL | 0 refills | Status: DC

## 2021-04-17 NOTE — Progress Notes (Signed)
? ?Acute Office Visit ? ?Subjective:  ? ? Patient ID: Mike Knight, male    DOB: 1970/07/21, 51 y.o.   MRN: [id number] ? ?Chief Complaint  ?Patient presents with  ? Herpes Zoster  ?  Had shingles in January and went to [facility] in [place] for treatment-took Valtrex. Used Lidocaine cream and it helped. Nerve pain is better but still there, no other medications were given.  ? ? ?HPI ?Patient is in today for Post herpetic neurolagia  ? ?Saw on 03/07/2021 for shingles and given valtrex at an urgent care. He was instructed to take tylenol. ? ?Was seen again on 03/16/2021 at urgent care for the discomfort associated with his dx. He was given lidocaine cream and instructed to use it twice daily. States it does help some ? ?States that he has the discomfort where the rash was, left hip/belt line area States that it is a dull ache. Does not have a sharp shooting electric pain.States that he cannot sleep on that side or it will wake him up and he has to take pressure off the area ? ?Past Medical History:  ?Diagnosis Date  ? Chronic idiopathic thrombocytopenia (HCC) 12/21/2006  ? Hypertension, essential 05/01/2014  ? Smoker   ? Transaminitis 2008  ? normal hep panel, ceruloplasmin, elevated ferritin  ? ? ?No past surgical history on file. ? ?Family History  ?Problem Relation Age of Onset  ? Cancer Father 68  ?     prostate s/p removal  ? Diabetes Paternal Grandfather   ? Hypertension Paternal Grandfather   ? Cancer Maternal Grandfather   ?     lung, smoker  ? ? ?Social History  ? ?Socioeconomic History  ? Marital status: Married  ?  Spouse name: Not on file  ? Number of children: Not on file  ? Years of education: Not on file  ? Highest education level: Not on file  ?Occupational History  ? Not on file  ?Tobacco Use  ? Smoking status: Former  ?  Types: Cigarettes  ?  Quit date: 09/15/2012  ?  Years since quitting: 8.5  ? Smokeless tobacco: Never  ?Substance and Sexual Activity  ? Alcohol use: Yes  ?  Alcohol/week: 0.0 standard  drinks  ?  Comment: occasional  ? Drug use: No  ? Sexual activity: Not on file  ?Other Topics Concern  ? Not on file  ?Social History Narrative  ? caffeine: occasional  ? Lives with wife and 2 sons, dog  ? Marital Status: Married  ? 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 Activity: active with work  ? Diet: more fruits/vegetables, good water  ? ?Social Determinants of Health  ? ?Financial Resource Strain: Not on file  ?Food Insecurity: Not on file  ?Transportation Needs: Not on file  ?Physical Activity: Not on file  ?Stress: Not on file  ?Social Connections: Not on file  ?Intimate Partner Violence: Not on file  ? ? ?Outpatient Medications Prior to Visit  ?Medication Sig Dispense Refill  ? amLODipine (NORVASC) 10 MG tablet Take 1 tablet (10 mg total) by mouth daily. 90 tablet 3  ? hydrochlorothiazide (HYDRODIURIL) 25 MG tablet Take 1 tablet (25 mg total) by mouth daily. 90 tablet 3  ? methocarbamol (ROBAXIN) 500 MG tablet Take 1-2 tablets (500-1,000 mg total) by mouth 3 (three) times daily as needed for muscle spasms (sedation precautions). (Patient not taking: Reported on 04/17/2021) 40 tablet 1  ? ?No facility-administered medications prior to visit.  ? ? ?No Known Allergies ? ?  Review of Systems  ?Constitutional:  Negative for chills and fever.  ?Respiratory:  Negative for cough and shortness of breath.   ?Cardiovascular:  Negative for chest pain.  ?Gastrointestinal:  Negative for nausea and vomiting.  ?Musculoskeletal:  Positive for myalgias.  ?Neurological:  Positive for numbness (with touch he can feel a litte numb).  ? ?   ?Objective:  ?  ?Physical Exam ?Vitals and nursing note reviewed.  ?Constitutional:   ?   Appearance: Normal appearance.  ?Cardiovascular:  ?   Rate and Rhythm: Normal rate and regular rhythm.  ?   Heart sounds: Normal heart sounds.  ?Pulmonary:  ?   Effort: Pulmonary effort is normal.  ?   Breath sounds: Normal breath sounds.  ?Abdominal:  ?   General: Bowel sounds are normal.  ?Skin: ?   General: Skin  is warm.  ?   Findings: No rash.  ? ?    ?Neurological:  ?   Mental Status: He is alert.  ? ? ?BP (!) 130/94   Pulse 80   Temp 97.7 ?F (36.5 ?C)   Ht 6\' 2"  (1.88 m)   Wt 251 lb (113.9 kg)   SpO2 98%   BMI 32.23 kg/m?  ?Wt Readings from Last 3 Encounters:  ?04/17/21 251 lb (113.9 kg)  ?10/08/20 246 lb (111.6 kg)  ?09/28/19 252 lb (114.3 kg)  ? ? ?Health Maintenance Due  ?Topic Date Due  ? HIV Screening  Never done  ? COLONOSCOPY (Pts 45-80yrs Insurance coverage will need to be confirmed)  Never done  ? COVID-19 Vaccine (3 - Booster) 08/02/2019  ? INFLUENZA VACCINE  Never done  ? Zoster Vaccines- Shingrix (1 of 2) Never done  ? ? ?There are no preventive care reminders to display for this patient. ? ? ?Lab Results  ?Component Value Date  ? TSH 0.85 05/01/2014  ? ?Lab Results  ?Component Value Date  ? WBC 5.1 10/08/2020  ? HGB 17.3 (H) 10/08/2020  ? HCT 49.4 10/08/2020  ? MCV 83.5 10/08/2020  ? PLT 111.0 (L) 10/08/2020  ? ?Lab Results  ?Component Value Date  ? NA 135 10/08/2020  ? K 4.4 10/08/2020  ? CO2 30 10/08/2020  ? GLUCOSE 95 10/08/2020  ? BUN 10 10/08/2020  ? CREATININE 0.92 10/08/2020  ? BILITOT 0.7 10/08/2020  ? ALKPHOS 51 10/08/2020  ? AST 20 10/08/2020  ? ALT 23 10/08/2020  ? PROT 7.2 10/08/2020  ? ALBUMIN 4.4 10/08/2020  ? CALCIUM 9.5 10/08/2020  ? GFR 97.30 10/08/2020  ? ?Lab Results  ?Component Value Date  ? CHOL 182 10/08/2020  ? ?Lab Results  ?Component Value Date  ? HDL 55.90 10/08/2020  ? ?Lab Results  ?Component Value Date  ? LDLCALC 111 (H) 10/08/2020  ? ?Lab Results  ?Component Value Date  ? TRIG 80.0 10/08/2020  ? ?Lab Results  ?Component Value Date  ? CHOLHDL 3 10/08/2020  ? ?No results found for: HGBA1C ? ?   ?Assessment & Plan:  ? ?Problem List Items Addressed This Visit   ? ?  ? Nervous and Auditory  ? Post herpetic neuralgia - Primary  ?  Patient experiencing postherpetic neuralgia due to shingles.  He has tried over-the-counter Tylenol and some lidocaine cream with moderate relief.  We  will send in lidocaine 5% patches.  Did discuss with patient if too expensive or insurance does not cover he can do 4% patches over-the-counter.  Also give him some gabapentin at nighttime to help him rest.  He can update me if this is beneficial or not.  Follow-up if symptoms fail to improve or worsen. ?  ?  ? Relevant Medications  ? gabapentin (NEURONTIN) 100 MG capsule  ? lidocaine (LIDODERM) 5 %  ? ? ? ?Meds ordered this encounter  ?Medications  ? gabapentin (NEURONTIN) 100 MG capsule  ?  Sig: Take 1 capsule (100 mg total) by mouth at bedtime.  ?  Dispense:  15 capsule  ?  Refill:  0  ?  Order Specific Question:   Supervising Provider  ?  Answer:   Roxy Manns A [[id number]]  ? lidocaine (LIDODERM) 5 %  ?  Sig: Place 1 patch onto the skin daily. Remove & Discard patch within 12 hours or as directed by MD. 12 hours with patch on and then a full 12 hours with patch off  ?  Dispense:  15 patch  ?  Refill:  0  ?  Order Specific Question:   Supervising Provider  ?  Answer:   Roxy Manns A [[id number]]  ? ?This visit occurred during the SARS-CoV-2 public health emergency.  Safety protocols were in place, including screening questions prior to the visit, additional usage of staff PPE, and extensive cleaning of exam room while observing appropriate contact time as indicated for disinfecting solutions.  ? ?Audria Nine, NP ? ?

## 2021-04-17 NOTE — Assessment & Plan Note (Signed)
Patient experiencing postherpetic neuralgia due to shingles.  He has tried over-the-counter Tylenol and some lidocaine cream with moderate relief.  We will send in lidocaine 5% patches.  Did discuss with patient if too expensive or insurance does not cover he can do 4% patches over-the-counter.  Also give him some gabapentin at nighttime to help him rest.  He can update me if this is beneficial or not.  Follow-up if symptoms fail to improve or worsen. ?

## 2021-04-17 NOTE — Patient Instructions (Signed)
Nice to see you today ?I sent in 2 prescriptions to your pharmacy  ?Follow up if no improvement or if symptoms worsen ?

## 2021-12-01 ENCOUNTER — Other Ambulatory Visit: Payer: Self-pay | Admitting: Family Medicine

## 2021-12-01 DIAGNOSIS — I10 Essential (primary) hypertension: Secondary | ICD-10-CM

## 2021-12-03 ENCOUNTER — Telehealth: Payer: Self-pay | Admitting: Family Medicine

## 2021-12-03 NOTE — Telephone Encounter (Signed)
Spoke with pt asking about what med he was looking for.  Says he actually got a call from [facility] today his HCTZ is ready to pick up.  Nothing else needed at this time.    Pt has HTN f/u on 12/09/21 at 8:30.

## 2021-12-03 NOTE — Telephone Encounter (Signed)
Patient is having a hard time getting his b/p medication from the pharmacy. Hees not sure if it needs a prior authorization or he needs to come in for an appointment before he can get it filled?please advise.

## 2021-12-09 ENCOUNTER — Encounter: Payer: Self-pay | Admitting: Family Medicine

## 2021-12-09 ENCOUNTER — Ambulatory Visit: Payer: Federal, State, Local not specified - PPO | Admitting: Family Medicine

## 2021-12-09 VITALS — BP 134/82 | HR 94 | Temp 97.6°F | Ht 74.0 in | Wt 234.2 lb

## 2021-12-09 DIAGNOSIS — Z1211 Encounter for screening for malignant neoplasm of colon: Secondary | ICD-10-CM

## 2021-12-09 DIAGNOSIS — I10 Essential (primary) hypertension: Secondary | ICD-10-CM

## 2021-12-09 DIAGNOSIS — Z125 Encounter for screening for malignant neoplasm of prostate: Secondary | ICD-10-CM

## 2021-12-09 DIAGNOSIS — D693 Immune thrombocytopenic purpura: Secondary | ICD-10-CM | POA: Diagnosis not present

## 2021-12-09 DIAGNOSIS — E785 Hyperlipidemia, unspecified: Secondary | ICD-10-CM

## 2021-12-09 DIAGNOSIS — E66811 Obesity, class 1: Secondary | ICD-10-CM

## 2021-12-09 DIAGNOSIS — E669 Obesity, unspecified: Secondary | ICD-10-CM

## 2021-12-09 LAB — COMPREHENSIVE METABOLIC PANEL
ALT: 28 U/L (ref 0–53)
AST: 22 U/L (ref 0–37)
Albumin: 4.6 g/dL (ref 3.5–5.2)
Alkaline Phosphatase: 59 U/L (ref 39–117)
BUN: 17 mg/dL (ref 6–23)
CO2: 30 mEq/L (ref 19–32)
Calcium: 9.4 mg/dL (ref 8.4–10.5)
Chloride: 97 mEq/L (ref 96–112)
Creatinine, Ser: 0.99 mg/dL (ref 0.40–1.50)
GFR: 88.38 mL/min (ref >60.00)
Glucose, Bld: 101 mg/dL — ABNORMAL HIGH (ref 70–99)
Potassium: 4.3 mEq/L (ref 3.5–5.1)
Sodium: 135 mEq/L (ref 135–145)
Total Bilirubin: 0.7 mg/dL (ref 0.2–1.2)
Total Protein: 7.2 g/dL (ref 6.0–8.3)

## 2021-12-09 LAB — CBC WITH DIFFERENTIAL/PLATELET
Basophils Absolute: 0 10*3/uL (ref 0.0–0.1)
Basophils Relative: 0.7 % (ref 0.0–3.0)
Eosinophils Absolute: 0.1 10*3/uL (ref 0.0–0.7)
Eosinophils Relative: 2.6 % (ref 0.0–5.0)
HCT: 50.5 % (ref 39.0–52.0)
Hemoglobin: 17 g/dL (ref 13.0–17.0)
Lymphocytes Relative: 27.7 % (ref 12.0–46.0)
Lymphs Abs: 1.5 10*3/uL (ref 0.7–4.0)
MCHC: 33.8 g/dL (ref 30.0–36.0)
MCV: 82.9 fl (ref 78.0–100.0)
Monocytes Absolute: 0.6 10*3/uL (ref 0.1–1.0)
Monocytes Relative: 10.7 % (ref 3.0–12.0)
Neutro Abs: 3.2 10*3/uL (ref 1.4–7.7)
Neutrophils Relative %: 58.3 % (ref 43.0–77.0)
Platelets: 166 10*3/uL (ref 150.0–400.0)
RBC: 6.09 Mil/uL — ABNORMAL HIGH (ref 4.22–5.81)
RDW: 14.7 % (ref 11.5–15.5)
WBC: 5.6 10*3/uL (ref 4.0–10.5)

## 2021-12-09 LAB — LIPID PANEL
Cholesterol: 182 mg/dL (ref 0–200)
HDL: 60.6 mg/dL (ref >39.00)
LDL Cholesterol: 109 mg/dL — ABNORMAL HIGH (ref 0–99)
NonHDL: 121.18
Total CHOL/HDL Ratio: 3
Triglycerides: 61 mg/dL (ref 0.0–149.0)
VLDL: 12.2 mg/dL (ref 0.0–40.0)

## 2021-12-09 LAB — MICROALBUMIN / CREATININE URINE RATIO
Creatinine,U: 124 mg/dL
Microalb Creat Ratio: 0.6 mg/g (ref 0.0–30.0)
Microalb, Ur: <0.7 mg/dL (ref 0.0–1.9)

## 2021-12-09 LAB — PSA: PSA: 0.85 ng/mL (ref 0.10–4.00)

## 2021-12-09 MED ORDER — FISH OIL 1000 MG PO CAPS: 1.0000 | ORAL_CAPSULE | Freq: Every day | ORAL | 0 refills | Status: AC

## 2021-12-09 MED ORDER — AMLODIPINE BESYLATE 10 MG PO TABS: 10.0000 mg | ORAL_TABLET | Freq: Every day | ORAL | 3 refills | Status: DC

## 2021-12-09 MED ORDER — MULTIVITAMIN ADULT PO TABS: 1.0000 | ORAL_TABLET | Freq: Every day | ORAL | Status: AC

## 2021-12-09 MED ORDER — HYDROCHLOROTHIAZIDE 25 MG PO TABS: 25.0000 mg | ORAL_TABLET | Freq: Every day | ORAL | 3 refills | Status: DC

## 2021-12-09 NOTE — Patient Instructions (Addendum)
Labs today Pass by lab for a stool test BP medicines refilled. Return as needed or in 1 year for physical.  Congrats on weight loss to date!

## 2021-12-09 NOTE — Progress Notes (Signed)
Patient ID: Mike Knight, male    DOB: 29-Dec-1970, 51 y.o.   MRN: [id number]  This visit was conducted in person.  BP 134/82   Pulse 94   Temp 97.6 F (36.4 C) (Temporal)   Ht 6\' 2"  (1.88 m)   Wt 234 lb 4 oz (106.3 kg)   SpO2 95%   BMI 30.08 kg/m    CC: f/u HTN, cough Subjective:   HPI: Mike Knight is a 51 y.o. male presenting on 12/09/2021 for Hypertension (Here for f/u. )   2 wk h/o rhinorrhea, cough. Symptoms fully resolved.   HTN - Compliant with current antihypertensive regimen of hctz 25mg  daily, amlodipine 10mg  daily. Does not check blood pressures at home. No low blood pressure readings or symptoms of dizziness/syncope. Denies HA, vision changes, CP/tightness, SOB, leg swelling.    PHN - saw Mike Knight 04/2021 started on gabapentin 100mg  at bedtime - this has fully resolved.   Colon cancer screen - discussed, would like iFOB.  Prostate screen - rec yearly given fmhx (father age 66yo).      Relevant past medical, surgical, family and social history reviewed and updated as indicated. Interim medical history since our last visit reviewed. Allergies and medications reviewed and updated. Outpatient Medications Prior to Visit  Medication Sig Dispense Refill   amLODipine (NORVASC) 10 MG tablet Take 1 tablet (10 mg total) by mouth daily. 90 tablet 3   gabapentin (NEURONTIN) 100 MG capsule Take 1 capsule (100 mg total) by mouth at bedtime. 15 capsule 0   hydrochlorothiazide (HYDRODIURIL) 25 MG tablet TAKE 1 TABLET(25 MG) BY MOUTH DAILY 90 tablet 0   lidocaine (LIDODERM) 5 % Place 1 patch onto the skin daily. Remove & Discard patch within 12 hours or as directed by MD. 12 hours with patch on and then a full 12 hours with patch off 15 patch 0   methocarbamol (ROBAXIN) 500 MG tablet Take 1-2 tablets (500-1,000 mg total) by mouth 3 (three) times daily as needed for muscle spasms (sedation precautions). 40 tablet 1   No facility-administered medications prior to visit.      Per HPI unless specifically indicated in ROS section below Review of Systems  Objective:  BP 134/82   Pulse 94   Temp 97.6 F (36.4 C) (Temporal)   Ht 6\' 2"  (1.88 m)   Wt 234 lb 4 oz (106.3 kg)   SpO2 95%   BMI 30.08 kg/m   Wt Readings from Last 3 Encounters:  12/09/21 234 lb 4 oz (106.3 kg)  04/17/21 251 lb (113.9 kg)  10/08/20 246 lb (111.6 kg)      Physical Exam Vitals and nursing note reviewed.  Constitutional:      Appearance: Normal appearance. He is not ill-appearing.  HENT:     Head: Normocephalic and atraumatic.     Mouth/Throat:     Mouth: Mucous membranes are moist.     Pharynx: Oropharynx is clear. No oropharyngeal exudate or posterior oropharyngeal erythema.  Eyes:     Extraocular Movements: Extraocular movements intact.     Pupils: Pupils are equal, round, and reactive to light.  Neck:     Thyroid: No thyroid mass or thyromegaly.  Cardiovascular:     Rate and Rhythm: Normal rate and regular rhythm.     Pulses: Normal pulses.     Heart sounds: Normal heart sounds. No murmur heard. Pulmonary:     Effort: Pulmonary effort is normal. No respiratory distress.     Breath sounds:  Normal breath sounds. No wheezing, rhonchi or rales.  Musculoskeletal:     Cervical back: Normal range of motion and neck supple. No rigidity.     Right lower leg: No edema.     Left lower leg: No edema.  Lymphadenopathy:     Cervical: No cervical adenopathy.  Skin:    General: Skin is warm and dry.     Findings: No rash.  Neurological:     Mental Status: He is alert.  Psychiatric:        Mood and Affect: Mood normal.        Behavior: Behavior normal.       Results for orders placed or performed in visit on 10/08/20  Microalbumin / creatinine urine ratio  Result Value Ref Range   Microalb, Ur <0.7 0.0 - 1.9 mg/dL   Creatinine,U 52.8 mg/dL   Microalb Creat Ratio 1.3 0.0 - 30.0 mg/g  Lipid panel  Result Value Ref Range   Cholesterol 182 0 - 200 mg/dL   Triglycerides  80.0 0.0 - 149.0 mg/dL   HDL 55.90 >39.00 mg/dL   VLDL 16.0 0.0 - 40.0 mg/dL   LDL Cholesterol 111 (H) 0 - 99 mg/dL   Total CHOL/HDL Ratio 3    NonHDL 126.54   PSA  Result Value Ref Range   PSA 0.85 0.10 - 4.00 ng/mL  CBC with Differential/Platelet  Result Value Ref Range   WBC 5.1 4.0 - 10.5 K/uL   RBC 5.92 (H) 4.22 - 5.81 Mil/uL   Hemoglobin 17.3 (H) 13.0 - 17.0 g/dL   HCT 49.4 39.0 - 52.0 %   MCV 83.5 78.0 - 100.0 fl   MCHC 34.9 30.0 - 36.0 g/dL   RDW 14.6 11.5 - 15.5 %   Platelets 111.0 (L) 150.0 - 400.0 K/uL   Neutrophils Relative % 60.5 43.0 - 77.0 %   Lymphocytes Relative 26.2 12.0 - 46.0 %   Monocytes Relative 9.7 3.0 - 12.0 %   Eosinophils Relative 3.1 0.0 - 5.0 %   Basophils Relative 0.5 0.0 - 3.0 %   Neutro Abs 3.1 1.4 - 7.7 K/uL   Lymphs Abs 1.3 0.7 - 4.0 K/uL   Monocytes Absolute 0.5 0.1 - 1.0 K/uL   Eosinophils Absolute 0.2 0.0 - 0.7 K/uL   Basophils Absolute 0.0 0.0 - 0.1 K/uL  Comprehensive metabolic panel  Result Value Ref Range   Sodium 135 135 - 145 mEq/L   Potassium 4.4 3.5 - 5.1 mEq/L   Chloride 98 96 - 112 mEq/L   CO2 30 19 - 32 mEq/L   Glucose, Bld 95 70 - 99 mg/dL   BUN 10 6 - 23 mg/dL   Creatinine, Ser 0.92 0.40 - 1.50 mg/dL   Total Bilirubin 0.7 0.2 - 1.2 mg/dL   Alkaline Phosphatase 51 39 - 117 U/L   AST 20 0 - 37 U/L   ALT 23 0 - 53 U/L   Total Protein 7.2 6.0 - 8.3 g/dL   Albumin 4.4 3.5 - 5.2 g/dL   GFR 97.30 >60.00 mL/min   Calcium 9.5 8.4 - 10.5 mg/dL    Assessment & Plan:   Problem List Items Addressed This Visit     Chronic idiopathic thrombocytopenia (HCC)    Update CBC. Chronic, present since 2008.       Relevant Orders   CBC with Differential/Platelet   Hypertension, essential - Primary    Chronic, stable on current regimen of hctz and amlodipine - continue.  Relevant Medications   amLODipine (NORVASC) 10 MG tablet   hydrochlorothiazide (HYDRODIURIL) 25 MG tablet   Other Relevant Orders   Lipid panel    Comprehensive metabolic panel   Microalbumin / creatinine urine ratio   Obesity, Class I, BMI 30-34.9    Congratulated on 17 lb weight loss since early this year - he's motivated to continue healthy diet and lifestyle choices. He's regularly boxing and using stationary bicycle.       HLD (hyperlipidemia)    Update labs.       Relevant Medications   amLODipine (NORVASC) 10 MG tablet   hydrochlorothiazide (HYDRODIURIL) 25 MG tablet   Other Visit Diagnoses     Special screening for malignant neoplasms, colon       Relevant Orders   Fecal occult blood, imunochemical   Special screening for malignant neoplasm of prostate       Relevant Orders   PSA        Meds ordered this encounter  Medications   amLODipine (NORVASC) 10 MG tablet    Sig: Take 1 tablet (10 mg total) by mouth daily.    Dispense:  90 tablet    Refill:  3   hydrochlorothiazide (HYDRODIURIL) 25 MG tablet    Sig: Take 1 tablet (25 mg total) by mouth daily.    Dispense:  90 tablet    Refill:  3   Multiple Vitamin (MULTIVITAMIN ADULT) TABS    Sig: Take 1 tablet by mouth daily.   Omega-3 Fatty Acids (FISH OIL) 1000 MG CAPS    Sig: Take 1 capsule (1,000 mg total) by mouth daily.    Refill:  0   Orders Placed This Encounter  Procedures   Fecal occult blood, imunochemical    Standing Status:   Future    Standing Expiration Date:   12/10/2022   Lipid panel   CBC with Differential/Platelet   PSA   Comprehensive metabolic panel   Microalbumin / creatinine urine ratio    Patient Instructions  Labs today Pass by lab for a stool test BP medicines refilled. Return as needed or in 1 year for physical.  Congrats on weight loss to date!   Follow up plan: Return in about 1 year (around 12/10/2022) for annual exam, prior fasting for blood work.  Ria Bush, MD

## 2021-12-09 NOTE — Assessment & Plan Note (Signed)
Update CBC. Chronic, present since 2008.

## 2021-12-09 NOTE — Assessment & Plan Note (Signed)
Congratulated on 17 lb weight loss since early this year - he's motivated to continue healthy diet and lifestyle choices. He's regularly boxing and using stationary bicycle.

## 2021-12-09 NOTE — Assessment & Plan Note (Signed)
Chronic, stable on current regimen of hctz and amlodipine - continue.

## 2021-12-09 NOTE — Assessment & Plan Note (Signed)
Update labs.  

## 2022-01-20 ENCOUNTER — Other Ambulatory Visit (INDEPENDENT_AMBULATORY_CARE_PROVIDER_SITE_OTHER): Payer: Federal, State, Local not specified - PPO

## 2022-01-20 DIAGNOSIS — Z1211 Encounter for screening for malignant neoplasm of colon: Secondary | ICD-10-CM

## 2022-01-21 LAB — FECAL OCCULT BLOOD, IMMUNOCHEMICAL: Fecal Occult Bld: NEGATIVE

## 2022-03-02 ENCOUNTER — Ambulatory Visit: Payer: Federal, State, Local not specified - PPO | Admitting: Family Medicine

## 2022-03-02 ENCOUNTER — Encounter: Payer: Self-pay | Admitting: Family Medicine

## 2022-03-02 VITALS — BP 124/80 | HR 103 | Temp 97.3°F | Ht 74.0 in | Wt 240.2 lb

## 2022-03-02 DIAGNOSIS — L0291 Cutaneous abscess, unspecified: Secondary | ICD-10-CM | POA: Diagnosis not present

## 2022-03-02 DIAGNOSIS — L02212 Cutaneous abscess of back [any part, except buttock]: Secondary | ICD-10-CM

## 2022-03-02 NOTE — Assessment & Plan Note (Signed)
Routine postprocedure instructions d/w pt- remove packing in 24-48h, keep area clean and bandaged, follow up if concerns/spreading erythema/pain.

## 2022-03-02 NOTE — Progress Notes (Signed)
Patient ID: Mike Knight, male    DOB: Nov 12, 1970, 52 y.o.   MRN: [id number]  This visit was conducted in person.  BP 124/80   Pulse (!) 103   Temp (!) 97.3 F (36.3 C)   Ht 6\' 2"  (1.88 m)   Wt 240 lb 4 oz (109 kg)   SpO2 97%   BMI 30.85 kg/m    CC: check growth on back  Subjective:   HPI: Mike Knight is a 52 y.o. male presenting on 03/02/2022 for Mass (C/o growth on back. Noticed about 2 wks ago. Area has increased in size and has constant pain. Pt accompanied by wife, Aniceto Boss. )   2 wk h/o growth on back - started itchy, now becoming painful. Trouble sleeping on back. Thought may have started after bite. Treating with ibuprofen and neosporin and vaseline without benefit. Trouble sleeping on back. Area mid lower back.   No h/o skin infections.      Relevant past medical, surgical, family and social history reviewed and updated as indicated. Interim medical history since our last visit reviewed. Allergies and medications reviewed and updated. Outpatient Medications Prior to Visit  Medication Sig Dispense Refill   amLODipine (NORVASC) 10 MG tablet Take 1 tablet (10 mg total) by mouth daily. 90 tablet 3   hydrochlorothiazide (HYDRODIURIL) 25 MG tablet Take 1 tablet (25 mg total) by mouth daily. 90 tablet 3   Multiple Vitamin (MULTIVITAMIN ADULT) TABS Take 1 tablet by mouth daily.     Omega-3 Fatty Acids (FISH OIL) 1000 MG CAPS Take 1 capsule (1,000 mg total) by mouth daily.  0   No facility-administered medications prior to visit.     Per HPI unless specifically indicated in ROS section below Review of Systems  Objective:  BP 124/80   Pulse (!) 103   Temp (!) 97.3 F (36.3 C)   Ht 6\' 2"  (1.88 m)   Wt 240 lb 4 oz (109 kg)   SpO2 97%   BMI 30.85 kg/m   Wt Readings from Last 3 Encounters:  03/02/22 240 lb 4 oz (109 kg)  12/09/21 234 lb 4 oz (106.3 kg)  04/17/21 251 lb (113.9 kg)      Physical Exam Vitals and nursing note reviewed.  Constitutional:       Appearance: Normal appearance. He is not ill-appearing.  Skin:         Comments: 4x4cm indurated fluctuant abscess to right mid back  Neurological:     Mental Status: He is alert.    I&D Meds, vitals, and allergies reviewed.   Indication: suspect abscess  Pt complaints of: erythema, pain, swelling  Location: right mid back  Size:4x4cm  Informed consent obtained.  Pt aware of risks not limited to but including infection, bleeding, damage to near by organs.  Prep: etoh/betadine  Anesthesia: 2%lidocaine with epi, good effect  Incision made with #11 blade  Wound explored and loculations removed  Wound packed with iodoform gauze  Tolerated well  Routine postprocedure instructions d/w pt- remove packing in 24-48h, keep area clean and bandaged, follow up if concerns/spreading erythema/pain.     Results for orders placed or performed in visit on 01/20/22  Fecal occult blood, imunochemical   Specimen: Stool  Result Value Ref Range   Fecal Occult Bld Negative Negative    Assessment & Plan:   Problem List Items Addressed This Visit     Abscess of back - Primary    Routine postprocedure instructions d/w pt- remove packing  in 24-48h, keep area clean and bandaged, follow up if concerns/spreading erythema/pain.       Relevant Orders   WOUND CULTURE     No orders of the defined types were placed in this encounter.   Orders Placed This Encounter  Procedures   WOUND CULTURE    Order Specific Question:   Source    Answer:   right mid back    Patient Instructions  Remove packing in 24-48h, keep area clean and bandaged, follow up if concerns/spreading erythema/pain.  Return in 3 days (Friday) for wound check.   Follow up plan: Return in about 3 days (around 03/05/2022) for follow up visit.  Ria Bush, MD

## 2022-03-02 NOTE — Patient Instructions (Signed)
Remove packing in 24-48h, keep area clean and bandaged, follow up if concerns/spreading erythema/pain.  Return in 3 days (Friday) for wound check.

## 2022-03-05 ENCOUNTER — Ambulatory Visit: Payer: Federal, State, Local not specified - PPO | Admitting: Family Medicine

## 2022-03-05 ENCOUNTER — Encounter: Payer: Self-pay | Admitting: Family Medicine

## 2022-03-05 VITALS — BP 136/82 | HR 86 | Temp 97.6°F | Ht 74.0 in | Wt 251.5 lb

## 2022-03-05 DIAGNOSIS — L02212 Cutaneous abscess of back [any part, except buttock]: Secondary | ICD-10-CM

## 2022-03-05 MED ORDER — DOXYCYCLINE HYCLATE 100 MG PO TABS: 100.0000 mg | ORAL_TABLET | Freq: Two times a day (BID) | ORAL | 0 refills | Status: DC

## 2022-03-05 MED ORDER — TRAMADOL HCL 50 MG PO TABS: 50.0000 mg | ORAL_TABLET | Freq: Three times a day (TID) | ORAL | 0 refills | Status: AC | PRN

## 2022-03-05 NOTE — Patient Instructions (Signed)
Take antibiotic course - doxycycline for 7 days.  Use ibuprofen 600mg  with meals for next 5 days.  May use tramadol for breakthrough pain as needed. Continue warm compresses to back but keep bandaid on during the day.  Return to work Wednesday.

## 2022-03-05 NOTE — Progress Notes (Signed)
Patient ID: Mike Knight, male    DOB: September 21, 1970, 52 y.o.   MRN: [id number]  This visit was conducted in person.  BP 136/82   Pulse 86   Temp 97.6 F (36.4 C) (Temporal)   Ht 6\' 2"  (1.88 m)   Wt 251 lb 8 oz (114.1 kg)   SpO2 95%   BMI 32.29 kg/m    CC: wound check Subjective:   HPI: Mike Knight is a 52 y.o. male presenting on 03/05/2022 for Wound Check (Here for 3 day f/u. Pt accompanied by wife, Aniceto Boss. )   See prior notes for details  Seen earlier this week s/p I&D of abscess on back. Tolerated well.  Since home, feeling well, no fevers, nausea or other systemic symptoms Wound culture still pending.      Relevant past medical, surgical, family and social history reviewed and updated as indicated. Interim medical history since our last visit reviewed. Allergies and medications reviewed and updated. Outpatient Medications Prior to Visit  Medication Sig Dispense Refill   amLODipine (NORVASC) 10 MG tablet Take 1 tablet (10 mg total) by mouth daily. 90 tablet 3   hydrochlorothiazide (HYDRODIURIL) 25 MG tablet Take 1 tablet (25 mg total) by mouth daily. 90 tablet 3   Multiple Vitamin (MULTIVITAMIN ADULT) TABS Take 1 tablet by mouth daily.     Omega-3 Fatty Acids (FISH OIL) 1000 MG CAPS Take 1 capsule (1,000 mg total) by mouth daily.  0   No facility-administered medications prior to visit.     Per HPI unless specifically indicated in ROS section below Review of Systems  Objective:  BP 136/82   Pulse 86   Temp 97.6 F (36.4 C) (Temporal)   Ht 6\' 2"  (1.88 m)   Wt 251 lb 8 oz (114.1 kg)   SpO2 95%   BMI 32.29 kg/m   Wt Readings from Last 3 Encounters:  03/05/22 251 lb 8 oz (114.1 kg)  03/02/22 240 lb 4 oz (109 kg)  12/09/21 234 lb 4 oz (106.3 kg)      Physical Exam Vitals and nursing note reviewed.  Constitutional:      Appearance: Normal appearance. He is not ill-appearing.  Musculoskeletal:        General: Tenderness present.  Skin:    General: Skin  is warm and dry.     Findings: No erythema.          Comments: Persistent induration around I&D incision site without erythema or drainage  Neurological:     Mental Status: He is alert.       Results for orders placed or performed in visit on 03/02/22  WOUND CULTURE   Specimen: Wound  Result Value Ref Range   MICRO NUMBER: [id number]    SPECIMEN QUALITY: Adequate    SOURCE: RIGHT MID BACK    STATUS: PRELIMINARY    GRAM STAIN:      Few Polymorphonuclear leukocytes Rare epithelial cells Many Gram positive cocci in pairs   RESULT: Culture in progress     Assessment & Plan:   Problem List Items Addressed This Visit     Abscess of back - Primary    Improvement after I&D, persistent surrounding induration. Wound care /dressing instructions provided.  Rx doxycycline 7d course, ibuprofen 600mg  TID PRN with meal, tramadol for breakthrough pain.  Update if not continuing to improve over time.        Meds ordered this encounter  Medications   doxycycline (VIBRA-TABS) 100 MG tablet  Sig: Take 1 tablet (100 mg total) by mouth 2 (two) times daily.    Dispense:  14 tablet    Refill:  0   traMADol (ULTRAM) 50 MG tablet    Sig: Take 1 tablet (50 mg total) by mouth every 8 (eight) hours as needed for up to 5 days.    Dispense:  15 tablet    Refill:  0    No orders of the defined types were placed in this encounter.   Patient Instructions  Take antibiotic course - doxycycline for 7 days.  Use ibuprofen 600mg  with meals for next 5 days.  May use tramadol for breakthrough pain as needed. Continue warm compresses to back but keep bandaid on during the day.  Return to work Wednesday.   Follow up plan: No follow-ups on file.  Ria Bush, MD

## 2022-03-05 NOTE — Assessment & Plan Note (Addendum)
Improvement after I&D, persistent surrounding induration. Wound care /dressing instructions provided.  Rx doxycycline 7d course, ibuprofen 600mg  TID PRN with meal, tramadol for breakthrough pain.  Update if not continuing to improve over time.

## 2022-03-06 LAB — WOUND CULTURE
MICRO NUMBER:: 14435230
SPECIMEN QUALITY:: ADEQUATE

## 2022-03-08 ENCOUNTER — Telehealth: Payer: Self-pay | Admitting: *Deleted

## 2022-03-08 NOTE — Telephone Encounter (Signed)
Left message on voicemail for patient to call the office back.Need to relay Dr. Bosie Clos message.  Please notify wound culture returned overall ok - how is back doing after I&D and on doxycycline/pain medicine?

## 2022-03-09 NOTE — Telephone Encounter (Signed)
Spoke with pt relaying results. Pt verbalizes understanding and states the area looks a lot better and is no longer painful.

## 2023-01-04 ENCOUNTER — Encounter: Payer: Self-pay | Admitting: Family Medicine

## 2023-01-04 ENCOUNTER — Ambulatory Visit: Payer: Federal, State, Local not specified - PPO | Admitting: Family Medicine

## 2023-01-04 VITALS — BP 124/82 | HR 81 | Temp 97.8°F | Ht 74.0 in | Wt 248.4 lb

## 2023-01-04 DIAGNOSIS — M5442 Lumbago with sciatica, left side: Secondary | ICD-10-CM | POA: Insufficient documentation

## 2023-01-04 MED ORDER — DEXAMETHASONE SODIUM PHOSPHATE 10 MG/ML IJ SOLN
10.0000 mg | Freq: Once | INTRAMUSCULAR | Status: AC
  Administered 2023-01-04: 10 mg via INTRAMUSCULAR

## 2023-01-04 MED ORDER — METHOCARBAMOL 500 MG PO TABS: 500.0000 mg | ORAL_TABLET | Freq: Three times a day (TID) | ORAL | 0 refills | Status: AC | PRN

## 2023-01-04 NOTE — Assessment & Plan Note (Addendum)
Story/exam consistent with L lumbar strain/spasm.  With positive SLR, anticipate component of radiculopathy. Rx dexamethasone 10mg  IM x1 today, discussed ice/heat, gentle stretching, continue OTC NSAID with meal as well as Rx robaxin muscle relaxant.  Update if not improving with treatment for PT referral.  Red flags to seek further care reviewed.

## 2023-01-04 NOTE — Patient Instructions (Addendum)
You have lumbar strain, with component of nerve pinching.  Treat with steroid shot today (dexamethasone) x1 then methocarbamol muscle relaxant. May continue ibuprofen 600mg  with meals for next several days. Let me know if not improving with this for PT referral.  Let us know right away if any bowel/bladder accidents fever, leg weakness develops.

## 2023-01-04 NOTE — Progress Notes (Signed)
Ph: [phone] Fax: [phone]   Patient ID: Mike Knight, male    DOB: November 23, 1970, 52 y.o.   MRN: [id number]  This visit was conducted in person.  BP 124/82   Pulse 81   Temp 97.8 F (36.6 C) (Oral)   Ht 6\' 2"  (1.88 m)   Wt 248 lb 6 oz (112.7 kg)   SpO2 98%   BMI 31.89 kg/m    CC: low back pain  Subjective:   HPI: Mike Knight is a 52 y.o. male presenting on 01/04/2023 for Back Pain (C/o L low back pain. Started 12/30/22 after getting something out of the car. Pt accompanied by wife, Rodney Booze. )   5d h/o L lower back pain that started after getting into wife's SUV. Felt sudden pulling sensation to left lower back. Back feels tight.  No fevers/chills, shooting pain down leg, numbness/weakness of leg, saddle anesthesia, bowel/bladder incontinence.  No inciting trauma or injury.   He's been treating with ibuprofen 600mg  with limited benefit and 800mg  at night, left over oxycodone. Tried heat initially.  Some improvement over the past 2 days.      Relevant past medical, surgical, family and social history reviewed and updated as indicated. Interim medical history since our last visit reviewed. Allergies and medications reviewed and updated. Outpatient Medications Prior to Visit  Medication Sig Dispense Refill   amLODipine (NORVASC) 10 MG tablet Take 1 tablet (10 mg total) by mouth daily. 90 tablet 3   hydrochlorothiazide (HYDRODIURIL) 25 MG tablet Take 1 tablet (25 mg total) by mouth daily. 90 tablet 3   Multiple Vitamin (MULTIVITAMIN ADULT) TABS Take 1 tablet by mouth daily.     Omega-3 Fatty Acids (FISH OIL) 1000 MG CAPS Take 1 capsule (1,000 mg total) by mouth daily.  0   doxycycline (VIBRA-TABS) 100 MG tablet Take 1 tablet (100 mg total) by mouth 2 (two) times daily. 14 tablet 0   No facility-administered medications prior to visit.     Per HPI unless specifically indicated in ROS section below Review of Systems  Objective:  BP 124/82   Pulse 81   Temp  97.8 F (36.6 C) (Oral)   Ht 6\' 2"  (1.88 m)   Wt 248 lb 6 oz (112.7 kg)   SpO2 98%   BMI 31.89 kg/m   Wt Readings from Last 3 Encounters:  01/04/23 248 lb 6 oz (112.7 kg)  03/05/22 251 lb 8 oz (114.1 kg)  03/02/22 240 lb 4 oz (109 kg)      Physical Exam Vitals and nursing note reviewed.  Constitutional:      Appearance: Normal appearance. He is not ill-appearing.  Musculoskeletal:        General: Tenderness present. Normal range of motion.     Right lower leg: No edema.     Left lower leg: No edema.     Comments:  + midline lumbar spine pain ++ L lumbar paraspinous mm tenderness with strain/spasm present + SLR on left. No pain with int/ext rotation at hip. Neg FABER. Discomfort with palpation at L SIJ.  No pain at GTB or sciatic notch bilaterally.   Skin:    General: Skin is warm and dry.     Findings: No rash.  Neurological:     Mental Status: He is alert.     Sensory: Sensation is intact.     Comments:  Diminished DTRs bilaterally  Sensation intact Antalgic gait       Lab Results  Component Value Date  NA 135 12/09/2021   CL 97 12/09/2021   K 4.3 12/09/2021   CO2 30 12/09/2021   BUN 17 12/09/2021   CREATININE 0.99 12/09/2021   GFR 88.38 12/09/2021   CALCIUM 9.4 12/09/2021   ALBUMIN 4.6 12/09/2021   GLUCOSE 101 (H) 12/09/2021   Assessment & Plan:   Problem List Items Addressed This Visit     Acute left-sided low back pain with left-sided sciatica - Primary    Story/exam consistent with L lumbar strain/spasm.  With positive SLR, anticipate component of radiculopathy. Rx dexamethasone 10mg  IM x1 today, discussed ice/heat, gentle stretching, continue OTC NSAID with meal as well as Rx robaxin muscle relaxant.  Update if not improving with treatment for PT referral.  Red flags to seek further care reviewed.       Relevant Medications   methocarbamol (ROBAXIN) 500 MG tablet     Meds ordered this encounter  Medications   methocarbamol (ROBAXIN) 500  MG tablet    Sig: Take 1-2 tablets (500-1,000 mg total) by mouth every 8 (eight) hours as needed for muscle spasms (sedation precautions).    Dispense:  40 tablet    Refill:  0   dexamethasone (DECADRON) injection 10 mg    No orders of the defined types were placed in this encounter.   Patient Instructions  You have lumbar strain, with component of nerve pinching.  Treat with steroid shot today (dexamethasone) x1 then methocarbamol muscle relaxant. May continue ibuprofen 600mg  with meals for next several days. Let me know if not improving with this for PT referral.  Let us know right away if any bowel/bladder accidents fever, leg weakness develops.   Follow up plan: Return if symptoms worsen or fail to improve.  Eustaquio Boyden, MD

## 2023-01-17 ENCOUNTER — Telehealth: Payer: Self-pay | Admitting: Family Medicine

## 2023-01-17 DIAGNOSIS — M5442 Lumbago with sciatica, left side: Secondary | ICD-10-CM

## 2023-01-17 NOTE — Telephone Encounter (Addendum)
Letter printed and in Lisa's box.  PT referral placed.

## 2023-01-17 NOTE — Telephone Encounter (Addendum)
Ok to extend out of work through 01/25/2023.  Is he interested in PT referral?  I will await FMLA forms.

## 2023-01-17 NOTE — Telephone Encounter (Signed)
Patient's wife contacted the office regarding visit from 11/19, states patient had some improvement in back pain but does not feel as if he can lift 50 lbs. Patient's wife wanted to know if Dr. Reece Agar could extend the date for when pt can return to work to be 01/25/2023? Says that patient was due to come back to work today but is still in pain. Says she can pick up this letter when ready, and will also drop off FMLA. Please advise

## 2023-01-17 NOTE — Addendum Note (Signed)
Addended by: Eustaquio Boyden on: 01/17/2023 05:12 PM   Modules accepted: Orders

## 2023-01-17 NOTE — Telephone Encounter (Signed)
Patient's wife called to follow up, advised of Dr. Timoteo Expose response and that they will get a call when letter is ready. Wife says patient is agreeable to a PT referral. Says she will bring FMLA when she comes to get the letter

## 2023-01-18 ENCOUNTER — Telehealth: Payer: Self-pay | Admitting: Family Medicine

## 2023-01-18 DIAGNOSIS — Z0279 Encounter for issue of other medical certificate: Secondary | ICD-10-CM

## 2023-01-18 NOTE — Telephone Encounter (Signed)
Spoke with pt notifying him work note is ready to pick up and a PT referral was placed. I let him know he will get a call to get scheduled. Pt verbalizes understanding and expresses his thanks.   [Placed work note at front office.]

## 2023-01-18 NOTE — Telephone Encounter (Signed)
Patient dropped off document FMLA, to be filled out by provider. Patient requested to send it back via Call Patient to pick up within 5-days. Document is located in providers tray at front office.Please advise at Mobile [phone] (mobile)

## 2023-01-18 NOTE — Telephone Encounter (Signed)
Filled out and in Lisa's box.  

## 2023-01-18 NOTE — Telephone Encounter (Signed)
Placed ppw in Dr. G's box.  ?

## 2023-01-18 NOTE — Telephone Encounter (Signed)
Spoke with pt notifying him FMLA ppw is ready to pick up and there is a $29.00 fee to have it completed. Pt verbalizes understanding.   [Placed ppw at front office. Made copy to scan.]

## 2023-08-13 DIAGNOSIS — I160 Hypertensive urgency: Secondary | ICD-10-CM | POA: Diagnosis not present

## 2023-08-13 DIAGNOSIS — I10 Essential (primary) hypertension: Secondary | ICD-10-CM | POA: Diagnosis not present

## 2023-08-13 DIAGNOSIS — M545 Low back pain, unspecified: Secondary | ICD-10-CM | POA: Diagnosis not present

## 2023-08-16 ENCOUNTER — Encounter: Payer: Self-pay | Admitting: Internal Medicine

## 2023-08-16 ENCOUNTER — Ambulatory Visit: Admitting: Internal Medicine

## 2023-08-16 VITALS — BP 138/88 | HR 84 | Temp 98.4°F | Ht 74.0 in | Wt 247.0 lb

## 2023-08-16 DIAGNOSIS — S39012D Strain of muscle, fascia and tendon of lower back, subsequent encounter: Secondary | ICD-10-CM | POA: Diagnosis not present

## 2023-08-16 NOTE — Patient Instructions (Signed)
 Please use ibuprofen 600-800mg  three times a day with food for the next week or so. Use the muscle relaxer if needed at bedtime

## 2023-08-16 NOTE — Assessment & Plan Note (Signed)
 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

## 2023-08-16 NOTE — Progress Notes (Signed)
 Subjective:    Patient ID: Mike Knight, male    DOB: 1970-07-28, 53 y.o.   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 90 tablet 3   hydrochlorothiazide  (HYDRODIURIL ) 25 MG tablet Take 1 tablet (25 mg total) by mouth daily. 90 tablet 3   methocarbamol  (ROBAXIN ) 500 MG tablet Take 1-2 tablets (500-1,000 mg total) by mouth every 8 (eight) hours as needed for muscle spasms (sedation precautions). 40 tablet 0   Multiple Vitamin (MULTIVITAMIN ADULT) TABS Take 1 tablet by mouth daily.     Omega-3 Fatty Acids (FISH OIL ) 1000 MG CAPS Take 1 capsule (1,000 mg total) by mouth daily.  0   No current facility-administered medications on file prior to visit.    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 No pertinent surgical history.  Family History  Problem Relation Age of Onset   Cancer Father 46       prostate s/p removal   Diabetes Paternal Grandfather    Hypertension Paternal Grandfather    Cancer Maternal Grandfather        lung, smoker     Social History   Socioeconomic History   Marital status: Married    Spouse name: Not on file   Number of children: Not on file   Years of education: Not on file   Highest education level: Not on file  Occupational History   Not on file  Tobacco Use   Smoking status: Former    Current packs/day: 0.00    Types: Cigarettes    Quit date: 09/15/2012    Years since quitting: 10.9   Smokeless tobacco: Never  Substance and Sexual Activity   Alcohol use: Yes    Alcohol/week: 0.0 standard drinks of alcohol    Comment: occasional   Drug use: No   Sexual activity: Not on file  Other Topics Concern   Not on file  Social History Narrative   caffeine: occasional   Lives with wife and 2 sons, dog   Marital Status: Married   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   Musculoskeletal:     Comments: No spine tenderness Limited spine flexion (`45 degrees) Tenderness along lateral upper left lumbar area--below CVA Normal hip ROM SLR negative   Neurological:     Mental Status: He is alert.     Comments: Normal gait No leg weakness           Assessment & Plan:

## 2023-12-06 ENCOUNTER — Telehealth: Payer: Self-pay

## 2023-12-06 DIAGNOSIS — I10 Essential (primary) hypertension: Secondary | ICD-10-CM

## 2023-12-06 NOTE — Telephone Encounter (Signed)
 Copied from CRM #[id number]. Topic: Clinical - Medication Prior Auth >> Dec 06, 2023 10:06 AM Aleatha BROCKS wrote: Reason for CRM: [facility] told patient he need a aprroval for his hydrochlorothiazide  (HYDRODIURIL ) 25 MG tablet [id number] [place], [place] - [address] AT [facility] OF [address] [place] [address] Phone: [phone] Fax: [phone] Hours: Not open 24 hours

## 2023-12-07 MED ORDER — AMLODIPINE BESYLATE 10 MG PO TABS: 10.0000 mg | ORAL_TABLET | Freq: Every day | ORAL | 0 refills | Status: DC

## 2023-12-07 MED ORDER — HYDROCHLOROTHIAZIDE 25 MG PO TABS: 25.0000 mg | ORAL_TABLET | Freq: Every day | ORAL | 0 refills | Status: DC

## 2023-12-07 NOTE — Telephone Encounter (Addendum)
 ERx amlodipine  and hydrochlorothiazide  medicines for next few months Please schedule CPE as overdue

## 2023-12-07 NOTE — Addendum Note (Signed)
 Addended by: RILLA BALLER on: 12/07/2023 02:04 PM   Modules accepted: Orders

## 2023-12-07 NOTE — Telephone Encounter (Signed)
 Please advise don't see where you have refilled in last year?

## 2023-12-08 NOTE — Telephone Encounter (Signed)
 lvm for pt to call office to schedule appt.

## 2023-12-12 NOTE — Telephone Encounter (Signed)
 Sent Message to email on file

## 2023-12-12 NOTE — Telephone Encounter (Signed)
 Lvm that patient is overdue for CPE. Needs to schedule for CPE and Lab work one week prior

## 2024-03-17 ENCOUNTER — Other Ambulatory Visit: Payer: Self-pay | Admitting: Family Medicine

## 2024-03-17 DIAGNOSIS — I10 Essential (primary) hypertension: Secondary | ICD-10-CM

## 2024-03-19 NOTE — Telephone Encounter (Signed)
 ERx - note written to pharmacy to schedule CPE as due.

## 2024-03-19 NOTE — Telephone Encounter (Signed)
 We have reached out to patient several times at last refill to get appointment. Not scheduled. Ok to refill?
